# Patient Record
Sex: Male | Born: 1960 | Race: White | Hispanic: No | Marital: Married | Smoking: Never smoker
Health system: Southern US, Community
[De-identification: ages and names within clinical notes are randomized; demographics above are authoritative.]

## PROBLEM LIST (undated history)

## (undated) DIAGNOSIS — R519 Headache, unspecified: Secondary | ICD-10-CM

## (undated) DIAGNOSIS — IMO0002 Reserved for concepts with insufficient information to code with codable children: Secondary | ICD-10-CM

## (undated) DIAGNOSIS — R51 Headache: Secondary | ICD-10-CM

## (undated) DIAGNOSIS — D759 Disease of blood and blood-forming organs, unspecified: Secondary | ICD-10-CM

## (undated) DIAGNOSIS — D581 Hereditary elliptocytosis: Secondary | ICD-10-CM

## (undated) DIAGNOSIS — Q231 Congenital insufficiency of aortic valve: Secondary | ICD-10-CM

## (undated) DIAGNOSIS — D649 Anemia, unspecified: Secondary | ICD-10-CM

## (undated) DIAGNOSIS — F32A Depression, unspecified: Secondary | ICD-10-CM

## (undated) DIAGNOSIS — M51369 Other intervertebral disc degeneration, lumbar region without mention of lumbar back pain or lower extremity pain: Secondary | ICD-10-CM

## (undated) DIAGNOSIS — M5136 Other intervertebral disc degeneration, lumbar region: Secondary | ICD-10-CM

## (undated) DIAGNOSIS — Q2381 Bicuspid aortic valve: Secondary | ICD-10-CM

## (undated) DIAGNOSIS — Z9889 Other specified postprocedural states: Secondary | ICD-10-CM

## (undated) DIAGNOSIS — R112 Nausea with vomiting, unspecified: Secondary | ICD-10-CM

## (undated) DIAGNOSIS — Z87442 Personal history of urinary calculi: Secondary | ICD-10-CM

## (undated) DIAGNOSIS — R3915 Urgency of urination: Secondary | ICD-10-CM

## (undated) DIAGNOSIS — I7789 Other specified disorders of arteries and arterioles: Secondary | ICD-10-CM

## (undated) DIAGNOSIS — Z87820 Personal history of traumatic brain injury: Secondary | ICD-10-CM

## (undated) DIAGNOSIS — R351 Nocturia: Secondary | ICD-10-CM

## (undated) DIAGNOSIS — F329 Major depressive disorder, single episode, unspecified: Secondary | ICD-10-CM

## (undated) HISTORY — PX: CHOLECYSTECTOMY: SHX55

## (undated) HISTORY — PX: OTHER SURGICAL HISTORY: SHX169

---
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
## 1999-11-06 HISTORY — PX: URETEROLITHOTOMY: SHX71

## 2012-05-08 ENCOUNTER — Encounter (HOSPITAL_COMMUNITY): Payer: Self-pay

## 2012-05-08 ENCOUNTER — Observation Stay (HOSPITAL_COMMUNITY)
Admission: EM | Admit: 2012-05-08 | Discharge: 2012-05-09 | Disposition: A | Discharge status: Home / Self Care | Payer: BC Managed Care – PPO | Source: Ambulatory Visit | Attending: Urology | Admitting: Urology

## 2012-05-08 ENCOUNTER — Observation Stay (HOSPITAL_COMMUNITY): Payer: BC Managed Care – PPO | Admitting: Anesthesiology

## 2012-05-08 ENCOUNTER — Emergency Department (HOSPITAL_COMMUNITY): Payer: BC Managed Care – PPO

## 2012-05-08 ENCOUNTER — Encounter (HOSPITAL_COMMUNITY)
Admission: EM | Disposition: A | Discharge status: Home / Self Care | Payer: Self-pay | Source: Ambulatory Visit | Attending: Emergency Medicine

## 2012-05-08 ENCOUNTER — Encounter (HOSPITAL_COMMUNITY): Payer: Self-pay | Admitting: Anesthesiology

## 2012-05-08 DIAGNOSIS — N201 Calculus of ureter: Principal | ICD-10-CM | POA: Insufficient documentation

## 2012-05-08 DIAGNOSIS — N200 Calculus of kidney: Secondary | ICD-10-CM

## 2012-05-08 DIAGNOSIS — Z79899 Other long term (current) drug therapy: Secondary | ICD-10-CM | POA: Insufficient documentation

## 2012-05-08 LAB — POCT I-STAT, CHEM 8
Chloride: 106 mEq/L (ref 96–112)
Glucose, Bld: 111 mg/dL — ABNORMAL HIGH (ref 70–99)
HCT: 34 % — ABNORMAL LOW (ref 39.0–52.0)
Potassium: 3.7 mEq/L (ref 3.5–5.1)
Sodium: 142 mEq/L (ref 135–145)

## 2012-05-08 LAB — URINALYSIS, ROUTINE W REFLEX MICROSCOPIC
Bilirubin Urine: NEGATIVE
Nitrite: NEGATIVE
Specific Gravity, Urine: 1.028 (ref 1.005–1.030)
pH: 6 (ref 5.0–8.0)

## 2012-05-08 LAB — URINE MICROSCOPIC-ADD ON

## 2012-05-08 SURGERY — CYSTOURETEROSCOPY, WITH RETROGRADE PYELOGRAM AND STENT INSERTION
Anesthesia: General | Site: Ureter | Laterality: Right | Wound class: Clean Contaminated

## 2012-05-08 MED ORDER — IBUPROFEN 600 MG PO TABS: 600.0000 mg | ORAL_TABLET | Freq: Four times a day (QID) | ORAL | Status: DC | PRN

## 2012-05-08 MED ORDER — LIDOCAINE HCL 2 % EX GEL
CUTANEOUS | Status: DC | PRN
  Administered 2012-05-08: 1

## 2012-05-08 MED ORDER — KETOROLAC TROMETHAMINE 30 MG/ML IJ SOLN
INTRAMUSCULAR | Status: AC
  Filled 2012-05-08: qty 1

## 2012-05-08 MED ORDER — KETOROLAC TROMETHAMINE 30 MG/ML IJ SOLN
30.0000 mg | Freq: Once | INTRAMUSCULAR | Status: AC
  Administered 2012-05-08: 30 mg via INTRAVENOUS

## 2012-05-08 MED ORDER — MIDAZOLAM HCL 5 MG/5ML IJ SOLN
INTRAMUSCULAR | Status: DC | PRN
  Administered 2012-05-08: 0.5 mg via INTRAVENOUS

## 2012-05-08 MED ORDER — ACETAMINOPHEN 10 MG/ML IV SOLN
INTRAVENOUS | Status: DC | PRN
  Administered 2012-05-08: 1000 mg via INTRAVENOUS

## 2012-05-08 MED ORDER — PROPOFOL 10 MG/ML IV EMUL
INTRAVENOUS | Status: DC | PRN
  Administered 2012-05-08: 200 mg via INTRAVENOUS

## 2012-05-08 MED ORDER — DEXAMETHASONE SODIUM PHOSPHATE 4 MG/ML IJ SOLN
INTRAMUSCULAR | Status: DC | PRN
  Administered 2012-05-08: 10 mg via INTRAVENOUS

## 2012-05-08 MED ORDER — KETOROLAC TROMETHAMINE 30 MG/ML IJ SOLN: 15.0000 mg | Freq: Once | INTRAMUSCULAR | Status: AC | PRN

## 2012-05-08 MED ORDER — SODIUM CHLORIDE 0.9 % IR SOLN
Status: DC | PRN
  Administered 2012-05-08: 4000 mL

## 2012-05-08 MED ORDER — FENTANYL CITRATE 0.05 MG/ML IJ SOLN
100.0000 ug | Freq: Once | INTRAMUSCULAR | Status: AC
  Administered 2012-05-08: 100 ug via INTRAVENOUS

## 2012-05-08 MED ORDER — ONDANSETRON HCL 4 MG/2ML IJ SOLN
4.0000 mg | Freq: Once | INTRAMUSCULAR | Status: AC
  Administered 2012-05-08: 4 mg via INTRAVENOUS

## 2012-05-08 MED ORDER — LIDOCAINE HCL (CARDIAC) 20 MG/ML IV SOLN
INTRAVENOUS | Status: DC | PRN
  Administered 2012-05-08: 30 mg via INTRAVENOUS

## 2012-05-08 MED ORDER — DIAZEPAM 5 MG/ML IJ SOLN
5.0000 mg | Freq: Once | INTRAMUSCULAR | Status: AC
  Administered 2012-05-08: 5 mg via INTRAVENOUS

## 2012-05-08 MED ORDER — HYDROMORPHONE HCL PF 1 MG/ML IJ SOLN
0.5000 mg | Freq: Once | INTRAMUSCULAR | Status: AC
  Administered 2012-05-08: 0.5 mg via INTRAVENOUS
  Filled 2012-05-08: qty 1

## 2012-05-08 MED ORDER — FENTANYL CITRATE 0.05 MG/ML IJ SOLN: 25.0000 ug | INTRAMUSCULAR | Status: DC | PRN

## 2012-05-08 MED ORDER — IOHEXOL 300 MG/ML  SOLN
INTRAMUSCULAR | Status: DC | PRN
  Administered 2012-05-08: 10 mL

## 2012-05-08 MED ORDER — ONDANSETRON HCL 4 MG/2ML IJ SOLN
4.0000 mg | Freq: Once | INTRAMUSCULAR | Status: AC
  Administered 2012-05-08: 4 mg via INTRAVENOUS
  Filled 2012-05-08: qty 2

## 2012-05-08 MED ORDER — CIPROFLOXACIN IN D5W 400 MG/200ML IV SOLN
INTRAVENOUS | Status: DC | PRN
  Administered 2012-05-08: 400 mg via INTRAVENOUS

## 2012-05-08 MED ORDER — SODIUM CHLORIDE 0.9 % IV BOLUS (SEPSIS): 1000.0000 mL | Freq: Once | INTRAVENOUS | Status: AC

## 2012-05-08 MED ORDER — SUCCINYLCHOLINE CHLORIDE 20 MG/ML IJ SOLN
INTRAMUSCULAR | Status: DC | PRN
  Administered 2012-05-08: 150 mg via INTRAVENOUS

## 2012-05-08 MED ORDER — ONDANSETRON HCL 4 MG/2ML IJ SOLN: 4.0000 mg | Freq: Once | INTRAMUSCULAR | Status: AC

## 2012-05-08 MED ORDER — PROMETHAZINE HCL 25 MG/ML IJ SOLN
6.2500 mg | INTRAMUSCULAR | Status: DC | PRN
  Administered 2012-05-08: 6.25 mg via INTRAVENOUS

## 2012-05-08 MED ORDER — TAMSULOSIN HCL 0.4 MG PO CAPS: 0.4000 mg | ORAL_CAPSULE | Freq: Every day | ORAL | Status: DC

## 2012-05-08 MED ORDER — FENTANYL CITRATE 0.05 MG/ML IJ SOLN
100.0000 ug | Freq: Once | INTRAMUSCULAR | Status: AC
  Administered 2012-05-08: 100 ug via INTRAVENOUS
  Filled 2012-05-08: qty 2

## 2012-05-08 MED ORDER — DIAZEPAM 5 MG/ML IJ SOLN
INTRAMUSCULAR | Status: AC
  Filled 2012-05-08: qty 2

## 2012-05-08 MED ORDER — HYDROMORPHONE HCL PF 1 MG/ML IJ SOLN: 1.0000 mg | Freq: Once | INTRAMUSCULAR | Status: AC

## 2012-05-08 MED ORDER — ONDANSETRON 4 MG PO TBDP: 4.0000 mg | ORAL_TABLET | Freq: Three times a day (TID) | ORAL | Status: DC | PRN

## 2012-05-08 MED ORDER — FENTANYL CITRATE 0.05 MG/ML IJ SOLN
INTRAMUSCULAR | Status: DC | PRN
  Administered 2012-05-08 (×2): 25 ug via INTRAVENOUS

## 2012-05-08 MED ORDER — ONDANSETRON HCL 4 MG/2ML IJ SOLN
INTRAMUSCULAR | Status: AC
  Filled 2012-05-08: qty 2

## 2012-05-08 MED ORDER — ONDANSETRON HCL 4 MG/2ML IJ SOLN
INTRAMUSCULAR | Status: DC | PRN
  Administered 2012-05-08 (×2): 1 mg via INTRAVENOUS

## 2012-05-08 MED ORDER — FENTANYL CITRATE 0.05 MG/ML IJ SOLN
INTRAMUSCULAR | Status: AC
  Filled 2012-05-08: qty 2

## 2012-05-08 MED ORDER — LACTATED RINGERS IV SOLN
INTRAVENOUS | Status: DC | PRN
  Administered 2012-05-08 – 2012-05-09 (×2): via INTRAVENOUS

## 2012-05-08 MED ORDER — OXYCODONE-ACETAMINOPHEN 7.5-325 MG PO TABS: 1.0000 | ORAL_TABLET | ORAL | Status: DC | PRN

## 2012-05-08 MED ORDER — OXYCODONE-ACETAMINOPHEN 5-325 MG PO TABS
2.0000 | ORAL_TABLET | Freq: Once | ORAL | Status: AC
  Administered 2012-05-08: 2 via ORAL
  Filled 2012-05-08 (×2): qty 2

## 2012-05-08 MED ORDER — BELLADONNA ALKALOIDS-OPIUM 16.2-60 MG RE SUPP
RECTAL | Status: DC | PRN
  Administered 2012-05-08: 1 via RECTAL

## 2012-05-08 MED ORDER — DIAZEPAM 5 MG/ML IJ SOLN
5.0000 mg | Freq: Once | INTRAMUSCULAR | Status: AC
  Administered 2012-05-08: 5 mg via INTRAVENOUS
  Filled 2012-05-08: qty 2

## 2012-05-08 MED ORDER — HYDROMORPHONE HCL PF 1 MG/ML IJ SOLN
1.0000 mg | Freq: Once | INTRAMUSCULAR | Status: AC
  Administered 2012-05-08: 1 mg via INTRAVENOUS
  Filled 2012-05-08: qty 1

## 2012-05-08 MED ORDER — SODIUM CHLORIDE 0.9 % IV BOLUS (SEPSIS)
1000.0000 mL | Freq: Once | INTRAVENOUS | Status: AC
  Administered 2012-05-08: 1000 mL via INTRAVENOUS

## 2012-05-08 SURGICAL SUPPLY — 16 items
BAG URO CATCHER STRL LF (DRAPE) ×3 IMPLANT
BASKET ZERO TIP NITINOL 2.4FR (BASKET) ×2 IMPLANT
BSKT STON RTRVL ZERO TP 2.4FR (BASKET) ×2
CATH URET 5FR 28IN OPEN ENDED (CATHETERS) ×3 IMPLANT
CLOTH BEACON ORANGE TIMEOUT ST (SAFETY) ×3 IMPLANT
DRAPE CAMERA CLOSED 9X96 (DRAPES) ×3 IMPLANT
GLOVE SURG SS PI 8.0 STRL IVOR (GLOVE) ×3 IMPLANT
GOWN PREVENTION PLUS XLARGE (GOWN DISPOSABLE) ×3 IMPLANT
GOWN STRL REIN XL XLG (GOWN DISPOSABLE) ×3 IMPLANT
KIT BALLN UROMAX 15FX4 (MISCELLANEOUS) ×1 IMPLANT
KIT BALLN UROMAX 26 75X4 (MISCELLANEOUS) ×1
MANIFOLD NEPTUNE II (INSTRUMENTS) ×3 IMPLANT
PACK CYSTO (CUSTOM PROCEDURE TRAY) ×3 IMPLANT
SHEATH URET ACCESS 12FR/35CM (UROLOGICAL SUPPLIES) ×2 IMPLANT
STENT CONTOUR 6FRX26X.038 (STENTS) ×2 IMPLANT
TUBING CONNECTING 10 (TUBING) ×3 IMPLANT

## 2012-05-08 NOTE — ED Notes (Signed)
Right flank pain since driving here, hx of stones several years back and feels the same.

## 2012-05-08 NOTE — Anesthesia Preprocedure Evaluation (Signed)
Anesthesia Evaluation  Patient identified by MRN, date of birth, ID band Patient awake    Reviewed: Allergy & Precautions, H&P , NPO status , Patient's Chart, lab work & pertinent test results  Airway Mallampati: II TM Distance: <3 FB Neck ROM: Full    Dental No notable dental hx.    Pulmonary neg pulmonary ROS,  breath sounds clear to auscultation  Pulmonary exam normal       Cardiovascular negative cardio ROS  Rhythm:Regular Rate:Normal     Neuro/Psych negative neurological ROS  negative psych ROS   GI/Hepatic negative GI ROS, Neg liver ROS,   Endo/Other  negative endocrine ROS  Renal/GU negative Renal ROS  negative genitourinary   Musculoskeletal negative musculoskeletal ROS (+)   Abdominal   Peds negative pediatric ROS (+)  Hematology negative hematology ROS (+)   Anesthesia Other Findings   Reproductive/Obstetrics negative OB ROS                           Anesthesia Physical Anesthesia Plan  ASA: I and Emergent  Anesthesia Plan: General   Post-op Pain Management:    Induction:   Airway Management Planned: Oral ETT  Additional Equipment:   Intra-op Plan:   Post-operative Plan: Extubation in OR  Informed Consent: I have reviewed the patients History and Physical, chart, labs and discussed the procedure including the risks, benefits and alternatives for the proposed anesthesia with the patient or authorized representative who has indicated his/her understanding and acceptance.   Dental advisory given  Plan Discussed with: CRNA and Surgeon  Anesthesia Plan Comments:         Anesthesia Quick Evaluation

## 2012-05-08 NOTE — ED Provider Notes (Signed)
History     CSN: [id number]  Arrival date & time 05/08/12  1258   First MD Initiated Contact with Patient 05/08/12 1324      Chief Complaint  Patient presents with  . Flank Pain     HPI Patient presents with a sudden onset of right flank pain which is severe in nature associated with diaphoresis nausea.  Patient has known history of kidney stones in the past.  States that the pain feels identical to this type pain.  Patient had no prior problems before the onset of the pain. Past Medical History  Diagnosis Date  . Kidney stones     No past surgical history on file.  No family history on file.  History  Substance Use Topics  . Smoking status: Never Smoker   . Smokeless tobacco: Not on file  . Alcohol Use: No      Review of Systems  Unable to perform ROS   Allergies  Review of patient's allergies indicates no known allergies.  Home Medications   Current Outpatient Rx  Name Route Sig Dispense Refill  . IBUPROFEN 600 MG PO TABS Oral Take 1 tablet (600 mg total) by mouth every 6 (six) hours as needed for pain. 30 tablet 0  . OXYCODONE-ACETAMINOPHEN 7.5-325 MG PO TABS Oral Take 1 tablet by mouth every 4 (four) hours as needed for pain. 30 tablet 0  . TAMSULOSIN HCL 0.4 MG PO CAPS Oral Take 1 capsule (0.4 mg total) by mouth daily after supper. 15 capsule 0    BP 95/50  Pulse 57  Temp 98.1 F (36.7 C) (Oral)  Resp 16  SpO2 97%  Physical Exam  Nursing note and vitals reviewed. Constitutional: He is oriented to person, place, and time. He appears well-developed and well-nourished.  Non-toxic appearance. He does not appear ill. He appears distressed.  HENT:  Head: Normocephalic and atraumatic.  Eyes: Pupils are equal, round, and reactive to light.  Neck: Normal range of motion.  Cardiovascular: Normal rate and intact distal pulses.   Pulmonary/Chest: No respiratory distress.  Abdominal: Normal appearance. He exhibits no distension.  Musculoskeletal: Normal range  of motion.  Neurological: He is alert and oriented to person, place, and time. No cranial nerve deficit.  Skin: Skin is warm and dry. No rash noted.  Psychiatric: He has a normal mood and affect. His behavior is normal.    ED Course  Procedures (including critical care time) Scheduled Meds:    . fentaNYL      . fentaNYL  100 mcg Intravenous Once  . fentaNYL  100 mcg Intravenous Once  . ketorolac  30 mg Intravenous Once  . ketorolac      . ondansetron      . ondansetron  4 mg Intravenous Once  . ondansetron (ZOFRAN) IV  4 mg Intravenous Once  . oxyCODONE-acetaminophen  2 tablet Oral Once   Continuous Infusions:  PRN Meds:.  Labs Reviewed  URINALYSIS, ROUTINE W REFLEX MICROSCOPIC - Abnormal; Notable for the following:    Color, Urine AMBER (*)  BIOCHEMICALS MAY BE AFFECTED BY COLOR   APPearance CLOUDY (*)     Hgb urine dipstick LARGE (*)     Protein, ur 30 (*)     Leukocytes, UA TRACE (*)     All other components within normal limits  POCT I-STAT, CHEM 8 - Abnormal; Notable for the following:    Glucose, Bld 111 (*)     Hemoglobin 11.6 (*)     HCT  34.0 (*)     All other components within normal limits  URINE MICROSCOPIC-ADD ON   No results found.   1. Kidney stone       MDM         Nelia Shi, MD 05/08/12 [phone]

## 2012-05-08 NOTE — H&P (Signed)
Urology Admission H&P  Chief Complaint: right flank pain  History of Present Illness: Austin Bolton is a 51 yo WM with a history of stones who had the onset today of severe right flank pain with nausea that hasn't responded to IV meds.   A CT shows a 3.32mm right distal stone with some obstruction.  He has had prior stones and has required surgical removal of a prior stone.  He has no other associate signs or symptoms.  Past Medical History  Diagnosis Date  . Kidney stones    No past surgical history on file.  Home Medications: he reports no meds.  (Not in a hospital admission) Allergies: No Known Allergies  No family history on file. Social History:  reports that he has never smoked. He does not have any smokeless tobacco history on file. He reports that he uses illicit drugs (Marijuana). He reports that he does not drink alcohol.  Review of Systems  Constitutional: Positive for diaphoresis. Negative for fever.  HENT: Negative.   Eyes: Negative.   Respiratory: Negative.   Cardiovascular: Negative.   Gastrointestinal: Positive for nausea, vomiting and abdominal pain.       Right flank severe  Genitourinary:       Right stream deviation  Musculoskeletal: Negative.   Skin: Negative.   Neurological: Negative.   Endo/Heme/Allergies: Negative.   Psychiatric/Behavioral: Negative.     Physical Exam:  Vital signs in last 24 hours: Temp:  [96.9 F (36.1 C)-98.1 F (36.7 C)] 96.9 F (36.1 C) (07/04 1859) Pulse Rate:  [57-83] 81  (07/04 2123) Resp:  [16-20] 20  (07/04 2123) BP: (95-133)/(50-87) 121/68 mmHg (07/04 2123) SpO2:  [97 %-100 %] 100 % (07/04 2123) Physical Exam  Constitutional: He is oriented to person, place, and time. He appears well-developed and well-nourished. He appears distressed.  HENT:  Head: Normocephalic and atraumatic.  Neck: Normal range of motion. Neck supple.  Cardiovascular: Normal rate and regular rhythm.   Respiratory: Effort normal and breath sounds  normal.  GI: Soft. There is tenderness (right flank and lower quadrant).  Musculoskeletal: Normal range of motion.  Neurological: He is alert and oriented to person, place, and time.  Skin: Skin is warm. He is diaphoretic.  Psychiatric:       He is in severe pain but appropriate    Laboratory Data:  Results for orders placed during the hospital encounter of 05/08/12 (from the past 24 hour(s))  URINALYSIS, ROUTINE W REFLEX MICROSCOPIC     Status: Abnormal   Collection Time   05/08/12  1:46 PM      Component Value Range   Color, Urine AMBER (*) YELLOW   APPearance CLOUDY (*) CLEAR   Specific Gravity, Urine 1.028  1.005 - 1.030   pH 6.0  5.0 - 8.0   Glucose, UA NEGATIVE  NEGATIVE mg/dL   Hgb urine dipstick LARGE (*) NEGATIVE   Bilirubin Urine NEGATIVE  NEGATIVE   Ketones, ur NEGATIVE  NEGATIVE mg/dL   Protein, ur 30 (*) NEGATIVE mg/dL   Urobilinogen, UA 0.2  0.0 - 1.0 mg/dL   Nitrite NEGATIVE  NEGATIVE   Leukocytes, UA TRACE (*) NEGATIVE  URINE MICROSCOPIC-ADD ON     Status: Normal   Collection Time   05/08/12  1:46 PM      Component Value Range   Squamous Epithelial / LPF RARE  RARE   WBC, UA 0-2  <3 WBC/hpf   RBC / HPF TOO NUMEROUS TO COUNT  <3 RBC/hpf  Bacteria, UA RARE  RARE   Urine-Other FEW YEAST    POCT I-STAT, CHEM 8     Status: Abnormal   Collection Time   05/08/12  2:03 PM      Component Value Range   Sodium 142  135 - 145 mEq/L   Potassium 3.7  3.5 - 5.1 mEq/L   Chloride 106  96 - 112 mEq/L   BUN 13  6 - 23 mg/dL   Creatinine, Ser 4.69  0.50 - 1.35 mg/dL   Glucose, Bld 629 (*) 70 - 99 mg/dL   Calcium, Ion 5.28  1.12 - 1.23 mmol/L   TCO2 24  0 - 100 mmol/L   Hemoglobin 11.6 (*) 13.0 - 17.0 g/dL   HCT 41.3 (*) 24.4 - 01.0 %   No results found for this or any previous visit (from the past 240 hour(s)). Creatinine:  Basename 05/08/12 1403  CREATININE 0.90   Baseline Creatinine:0.90  CT films reviewed.  Impression/Assessment:  Symptomatic right distal  stone.  Plan:  Right ureteroscopic stone extraction with probable stent.   Risks reviewed.  Rhet Rorke J 05/08/2012, 10:02 PM

## 2012-05-08 NOTE — Brief Op Note (Signed)
05/08/2012  10:59 PM  PATIENT:  Karis Juba  51 y.o. male  PRE-OPERATIVE DIAGNOSIS:  Right Ureteral Stone  POST-OPERATIVE DIAGNOSIS:  Right Ureteral Stone  PROCEDURE:  Procedure(s) (LRB): CYSTOSCOPY WITH RETROGRADE PYELOGRAM, URETEROSCOPY WITH STONE EXTRACTION  AND STENT PLACEMENT (Right)  SURGEON:  Surgeon(s) and Role:    * Anner Crete, MD - Primary  PHYSICIAN ASSISTANT:   ASSISTANTS: none   ANESTHESIA:   general  EBL:  Total I/O In: -  Out: 100 [Urine:100]  BLOOD ADMINISTERED:none  DRAINS: 6x26 right JJ stent   LOCAL MEDICATIONS USED:  10ml 2% lidocaine jelly  SPECIMEN:  Source of Specimen:  stone  DISPOSITION OF SPECIMEN:  given to wife  COUNTS:  YES  TOURNIQUET:  * No tourniquets in log *  DICTATION: .Other Dictation: Dictation Number [id number]  PLAN OF CARE: Admit for overnight observation  PATIENT DISPOSITION:  PACU - hemodynamically stable.   Delay start of Pharmacological VTE agent (>24hrs) due to surgical blood loss or risk of bleeding: no

## 2012-05-08 NOTE — ED Provider Notes (Signed)
Pt continues to have persistent pain. Discussed with Dr Annabell Howells. Asked to give Pt valium and reassess. If symptoms improved can f/u in office. If not, transfer to [facility].   Pt symptoms improved momentarily with valium but then began again. Spoke with Dr Annabell Howells who will accept pt in transfer.   Loren Racer, MD 05/08/12 2031

## 2012-05-08 NOTE — Anesthesia Postprocedure Evaluation (Signed)
  Anesthesia Post-op Note  Patient: Austin Bolton  Procedure(s) Performed: Procedure(s) (LRB): CYSTOSCOPY WITH RETROGRADE PYELOGRAM, URETEROSCOPY AND STENT PLACEMENT (Right)  Patient Location: PACU  Anesthesia Type: General  Level of Consciousness: awake and alert   Airway and Oxygen Therapy: Patient Spontanous Breathing  Post-op Pain: mild  Post-op Assessment: Post-op Vital signs reviewed, Patient's Cardiovascular Status Stable, Respiratory Function Stable, Patent Airway and No signs of Nausea or vomiting  Post-op Vital Signs: stable  Complications: No apparent anesthesia complications

## 2012-05-08 NOTE — OR Nursing (Signed)
Right Ureteral Stone sent with Dr. Annabell Howells

## 2012-05-08 NOTE — Transfer of Care (Signed)
Immediate Anesthesia Transfer of Care Note  Patient: Austin Bolton  Procedure(s) Performed: Procedure(s) (LRB): CYSTOSCOPY WITH RETROGRADE PYELOGRAM, URETEROSCOPY AND STENT PLACEMENT (Right)  Patient Location: PACU  Anesthesia Type: General  Level of Consciousness: awake and sedated  Airway & Oxygen Therapy: Patient Spontanous Breathing and Patient connected to face mask  Post-op Assessment: Report given to PACU RN and Post -op Vital signs reviewed and stable  Post vital signs: Reviewed and stable  Complications: No apparent anesthesia complications

## 2012-05-09 ENCOUNTER — Other Ambulatory Visit: Payer: Self-pay | Admitting: Urology

## 2012-05-09 ENCOUNTER — Encounter (HOSPITAL_BASED_OUTPATIENT_CLINIC_OR_DEPARTMENT_OTHER): Payer: Self-pay | Admitting: *Deleted

## 2012-05-09 MED ORDER — ONDANSETRON HCL 4 MG/2ML IJ SOLN: 4.0000 mg | INTRAMUSCULAR | Status: DC | PRN

## 2012-05-09 MED ORDER — HYOSCYAMINE SULFATE 0.125 MG SL SUBL: 0.1250 mg | SUBLINGUAL_TABLET | SUBLINGUAL | Status: DC | PRN

## 2012-05-09 MED ORDER — TAMSULOSIN HCL 0.4 MG PO CAPS: 0.4000 mg | ORAL_CAPSULE | Freq: Every day | ORAL | Status: DC

## 2012-05-09 MED ORDER — OXYCODONE-ACETAMINOPHEN 5-325 MG PO TABS: 1.0000 | ORAL_TABLET | ORAL | Status: DC | PRN

## 2012-05-09 MED ORDER — ZOLPIDEM TARTRATE 5 MG PO TABS: 5.0000 mg | ORAL_TABLET | Freq: Every evening | ORAL | Status: DC | PRN

## 2012-05-09 MED ORDER — HYOSCYAMINE SULFATE 0.125 MG SL SUBL
0.1250 mg | SUBLINGUAL_TABLET | SUBLINGUAL | Status: DC | PRN
  Administered 2012-05-09 (×2): 0.125 mg via ORAL
  Filled 2012-05-09 (×2): qty 1

## 2012-05-09 MED ORDER — TAMSULOSIN HCL 0.4 MG PO CAPS
0.4000 mg | ORAL_CAPSULE | Freq: Every day | ORAL | Status: DC
  Administered 2012-05-09: 0.4 mg via ORAL
  Filled 2012-05-09 (×2): qty 1

## 2012-05-09 MED ORDER — OXYCODONE-ACETAMINOPHEN 5-325 MG PO TABS: 1.0000 | ORAL_TABLET | Freq: Four times a day (QID) | ORAL | Status: AC | PRN

## 2012-05-09 MED ORDER — PHENAZOPYRIDINE HCL 200 MG PO TABS
200.0000 mg | ORAL_TABLET | Freq: Three times a day (TID) | ORAL | Status: DC | PRN
  Administered 2012-05-09: 200 mg via ORAL
  Filled 2012-05-09: qty 1

## 2012-05-09 MED ORDER — ACETAMINOPHEN 325 MG PO TABS: 650.0000 mg | ORAL_TABLET | ORAL | Status: DC | PRN

## 2012-05-09 MED ORDER — PHENAZOPYRIDINE HCL 200 MG PO TABS: 200.0000 mg | ORAL_TABLET | Freq: Three times a day (TID) | ORAL | Status: AC | PRN

## 2012-05-09 MED ORDER — DIAZEPAM 5 MG PO TABS: 5.0000 mg | ORAL_TABLET | Freq: Four times a day (QID) | ORAL | Status: AC | PRN

## 2012-05-09 MED ORDER — POTASSIUM CHLORIDE IN NACL 20-0.45 MEQ/L-% IV SOLN
INTRAVENOUS | Status: DC
  Administered 2012-05-09: 01:00:00 via INTRAVENOUS
  Filled 2012-05-09 (×3): qty 1000

## 2012-05-09 MED ORDER — HYDROMORPHONE HCL PF 1 MG/ML IJ SOLN: 0.5000 mg | INTRAMUSCULAR | Status: DC | PRN

## 2012-05-09 MED ORDER — BISACODYL 10 MG RE SUPP: 10.0000 mg | Freq: Every day | RECTAL | Status: DC | PRN

## 2012-05-09 MED FILL — Lidocaine HCl Gel 2%: CUTANEOUS | Qty: 5 | Status: AC

## 2012-05-09 NOTE — Progress Notes (Signed)
NPO AFTER MN. ARRIVES AT 1015. CURRENT ISTAT DONE 05-08-2012 IN [facility]. NEEDS EKG. MAY TAKE RX PAIN MED IF NEEDED W/ SIPS OF WATER AM OF SURG.

## 2012-05-09 NOTE — Progress Notes (Signed)
DC to home. To car by [facility]. No change from AM assessment.Austin Bolton  

## 2012-05-09 NOTE — Discharge Summary (Signed)
Physician Discharge Summary  Patient ID: Austin Bolton MRN: [id number] DOB/AGE: [id number] y.o.  Admit date: 05/08/2012 Discharge date: 05/09/2012  Admission Diagnoses: right distal stone  Discharge Diagnoses: same Active Problems:  * No active hospital problems. *    Discharged Condition: good  Hospital Course: [name] was admitted yesterday for an obstructing 3mm right distal stone with intractable pain.   He had ureteroscopic stone extraction with right stent placement and is doing better with reduced pain this morning.   Consults: None  Significant Diagnostic Studies: CT scan  Treatments: surgery: ureteroscopy and stent.  Discharge Exam: Blood pressure 126/81, pulse 73, temperature 98.2 F (36.8 C), temperature source Oral, resp. rate 20, height 6\' 2"  (1.88 m), weight 81.647 kg (180 lb), SpO2 98.00%. General appearance: alert and no distress  Disposition: Home   Medication List  As of 05/09/2012  7:01 AM   TAKE these medications         diazepam 5 MG tablet   Commonly known as: VALIUM   Take 1 tablet (5 mg total) by mouth every 6 (six) hours as needed for anxiety (flank pain not controlled by pain med).      hyoscyamine 0.125 MG SL tablet   Commonly known as: LEVSIN SL   Take 1 tablet (0.125 mg total) by mouth every 4 (four) hours as needed.      oxyCODONE-acetaminophen 5-325 MG per tablet   Commonly known as: PERCOCET   Take 1-2 tablets by mouth every 6 (six) hours as needed.      phenazopyridine 200 MG tablet   Commonly known as: PYRIDIUM   Take 1 tablet (200 mg total) by mouth 3 (three) times daily as needed (burning).      Tamsulosin HCl 0.4 MG Caps   Commonly known as: FLOMAX   Take 1 capsule (0.4 mg total) by mouth daily after breakfast.           Follow-up Information    Call Anner Crete, MD. (Please make an appt for 1 week to have the stent removed)    Contact information:   [address] [place] [address] [place]  [address] [phone]         He will be set up for out patient stent removal next week.  SignedAnner Crete 05/09/2012, 7:01 AM

## 2012-05-09 NOTE — Op Note (Signed)
Austin Bolton, REASON                  ACCOUNT NO.:  [id number]  MEDICAL RECORD NO.:  [id number]  LOCATION:  [facility]                         FACILITY:  [facility]  PHYSICIAN:  Excell Seltzer. Annabell Howells, M.D.    DATE OF BIRTH:  10-15-1961  DATE OF PROCEDURE:  05/08/2012 DATE OF DISCHARGE:                              OPERATIVE REPORT   PROCEDURE:  Cystoscopy, right retrograde pyelogram, right ureteroscopic stone extraction, insertion of right double-J stent.  PREOPERATIVE DIAGNOSIS:  Right distal ureteral stone.  POSTOPERATIVE DIAGNOSIS:  Right distal ureteral stone.  SURGEON:  Excell Seltzer. Annabell Howells, MD  ANESTHESIA:  General.  SPECIMEN:  Stone.  DRAINS:  Six-French 26-cm double-J stent.  COMPLICATIONS:  None.  INDICATIONS:  Austin Bolton is a 51 year old white male, who had the onset today of severe right flank pain.  He was seen in the emergency room at [facility].  His pain remained intractable despite Dilaudid, fentanyl, Toradol, and Valium.  He was transferred to [facility] for definitive management of the stone.  FINDINGS OF PROCEDURE:  A full informed consent was reviewed with the patient preoperatively.  He was taken to the operating room where general anesthetic was induced.  He was given 400 mg of Cipro IV.  He was placed in lithotomy position.  His perineum and genitalia were prepped with Betadine solution and he was draped in usual sterile fashion.  Cystoscopy was performed using a 22-French scope and 12-degree lens. Examination revealed a normal urethra.  The external sphincter was intact.  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  Examination of bladder revealed some hypervascularity but no mucosal lesions, tumors, or stones were noted.  Ureteral orifices were unremarkable.  The right ureteral orifice was cannulated with 5-French open-end catheter and contrast was instilled, which revealed a very tight distal a centimeter and  half of the ureter with a filling defect proximal to this consists of the stone and mild dilation of the ureter proximally.  Once retrograde pyelogram had been performed, a guidewire was passed to the kidney without difficulty.  An attempt was then made to pass a 12-French introducer sheath dilator inner core to dilate the ureter.  This was unsuccessful due to the very tight intramural ureter.  I then passed a 4 cm x 15-French balloon over the wire through the cystoscope across the distal ureter, and the balloon was inflated to 18 atmospheres with disappearance of the waist.  Once the inflation had been completed, the balloon was deflated and removed.  The cystoscope was removed leaving the wire in place.  The 6.4-French short ureteroscope was then passed alongside the wire. There were some mucosal tearing and intramural ureter was expected with dilation.  Approximately the stone was identified, it had rounded all of brown appearance consistent with a calcium oxalate stone.  It was engaged with a Nitinol basket and removed intact.  The cystoscope was then reinserted over the guidewire and a 6-French 26- cm double-J stent was then inserted to the kidney under fluoroscopic guidance.  The wire was removed leaving good coil in the kidney, a good coil in the bladder.  The bladder was drained.  The cystoscope was removed.  Urethra was instilled with 10 mL of 2% lidocaine jelly, and a B and O suppository was placed.  He was taken down from lithotomy position.  His anesthetic was reversed.  He was moved to recovery room in stable condition.  There were no complications.     Excell Seltzer. Annabell Howells, M.D.     JJW/MEDQ  D:  05/08/2012  T:  05/09/2012  Job:  [id number]

## 2012-05-13 ENCOUNTER — Encounter (HOSPITAL_BASED_OUTPATIENT_CLINIC_OR_DEPARTMENT_OTHER)
Admission: RE | Disposition: A | Discharge status: Home / Self Care | Payer: Self-pay | Source: Ambulatory Visit | Attending: Urology

## 2012-05-13 ENCOUNTER — Ambulatory Visit (HOSPITAL_BASED_OUTPATIENT_CLINIC_OR_DEPARTMENT_OTHER): Payer: BC Managed Care – PPO | Admitting: Anesthesiology

## 2012-05-13 ENCOUNTER — Encounter (HOSPITAL_BASED_OUTPATIENT_CLINIC_OR_DEPARTMENT_OTHER): Payer: Self-pay | Admitting: *Deleted

## 2012-05-13 ENCOUNTER — Encounter (HOSPITAL_BASED_OUTPATIENT_CLINIC_OR_DEPARTMENT_OTHER): Payer: Self-pay | Admitting: Anesthesiology

## 2012-05-13 ENCOUNTER — Ambulatory Visit (HOSPITAL_BASED_OUTPATIENT_CLINIC_OR_DEPARTMENT_OTHER)
Admission: RE | Admit: 2012-05-13 | Discharge: 2012-05-13 | Disposition: A | Discharge status: Home / Self Care | Payer: BC Managed Care – PPO | Source: Ambulatory Visit | Attending: Urology | Admitting: Urology

## 2012-05-13 ENCOUNTER — Other Ambulatory Visit: Payer: Self-pay

## 2012-05-13 DIAGNOSIS — R109 Unspecified abdominal pain: Secondary | ICD-10-CM | POA: Insufficient documentation

## 2012-05-13 DIAGNOSIS — N201 Calculus of ureter: Secondary | ICD-10-CM | POA: Insufficient documentation

## 2012-05-13 DIAGNOSIS — R110 Nausea: Secondary | ICD-10-CM | POA: Insufficient documentation

## 2012-05-13 DIAGNOSIS — Z466 Encounter for fitting and adjustment of urinary device: Secondary | ICD-10-CM | POA: Insufficient documentation

## 2012-05-13 HISTORY — PX: CYSTOSCOPY W/ URETERAL STENT REMOVAL: SHX1430

## 2012-05-13 HISTORY — DX: Personal history of traumatic brain injury: Z87.820

## 2012-05-13 HISTORY — DX: Personal history of urinary calculi: Z87.442

## 2012-05-13 HISTORY — DX: Nocturia: R35.1

## 2012-05-13 HISTORY — DX: Urgency of urination: R39.15

## 2012-05-13 HISTORY — DX: Other specified disorders of arteries and arterioles: I77.89

## 2012-05-13 SURGERY — REMOVAL, STENT, URETER, CYSTOSCOPIC
Anesthesia: General | Site: Ureter | Laterality: Right | Wound class: Clean Contaminated

## 2012-05-13 MED ORDER — PROMETHAZINE HCL 25 MG/ML IJ SOLN: 6.2500 mg | INTRAMUSCULAR | Status: DC | PRN

## 2012-05-13 MED ORDER — PROPOFOL 10 MG/ML IV EMUL
INTRAVENOUS | Status: DC | PRN
  Administered 2012-05-13: 200 mg via INTRAVENOUS

## 2012-05-13 MED ORDER — SODIUM CHLORIDE 0.9 % IV SOLN: 250.0000 mL | INTRAVENOUS | Status: DC | PRN

## 2012-05-13 MED ORDER — SODIUM CHLORIDE 0.9 % IJ SOLN: 3.0000 mL | INTRAMUSCULAR | Status: DC | PRN

## 2012-05-13 MED ORDER — CIPROFLOXACIN IN D5W 400 MG/200ML IV SOLN
400.0000 mg | INTRAVENOUS | Status: AC
  Administered 2012-05-13: 400 mg via INTRAVENOUS

## 2012-05-13 MED ORDER — CIPROFLOXACIN IN D5W 400 MG/200ML IV SOLN: 400.0000 mg | INTRAVENOUS | Status: DC

## 2012-05-13 MED ORDER — LACTATED RINGERS IV SOLN
INTRAVENOUS | Status: DC
  Administered 2012-05-13: 100 mL/h via INTRAVENOUS
  Administered 2012-05-13: 11:00:00 via INTRAVENOUS

## 2012-05-13 MED ORDER — FENTANYL CITRATE 0.05 MG/ML IJ SOLN: 25.0000 ug | INTRAMUSCULAR | Status: DC | PRN

## 2012-05-13 MED ORDER — OXYCODONE HCL 5 MG PO TABS
5.0000 mg | ORAL_TABLET | ORAL | Status: DC | PRN
  Administered 2012-05-13: 5 mg via ORAL

## 2012-05-13 MED ORDER — ONDANSETRON HCL 4 MG/2ML IJ SOLN
4.0000 mg | Freq: Once | INTRAMUSCULAR | Status: AC
  Administered 2012-05-13: 4 mg via INTRAVENOUS

## 2012-05-13 MED ORDER — ACETAMINOPHEN 650 MG RE SUPP: 650.0000 mg | RECTAL | Status: DC | PRN

## 2012-05-13 MED ORDER — DEXAMETHASONE SODIUM PHOSPHATE 4 MG/ML IJ SOLN
INTRAMUSCULAR | Status: DC | PRN
  Administered 2012-05-13: 10 mg via INTRAVENOUS

## 2012-05-13 MED ORDER — ONDANSETRON HCL 4 MG/2ML IJ SOLN
INTRAMUSCULAR | Status: DC | PRN
  Administered 2012-05-13: 4 mg via INTRAVENOUS

## 2012-05-13 MED ORDER — LIDOCAINE HCL (CARDIAC) 20 MG/ML IV SOLN
INTRAVENOUS | Status: DC | PRN
  Administered 2012-05-13: 60 mg via INTRAVENOUS

## 2012-05-13 MED ORDER — BELLADONNA ALKALOIDS-OPIUM 16.2-60 MG RE SUPP
RECTAL | Status: DC | PRN
  Administered 2012-05-13: 1 via RECTAL

## 2012-05-13 MED ORDER — METOCLOPRAMIDE HCL 5 MG/ML IJ SOLN
INTRAMUSCULAR | Status: DC | PRN
  Administered 2012-05-13 (×2): 5 mg via INTRAVENOUS

## 2012-05-13 MED ORDER — KETOROLAC TROMETHAMINE 30 MG/ML IJ SOLN
INTRAMUSCULAR | Status: DC | PRN
  Administered 2012-05-13: 30 mg via INTRAVENOUS

## 2012-05-13 MED ORDER — FENTANYL CITRATE 0.05 MG/ML IJ SOLN
INTRAMUSCULAR | Status: DC | PRN
  Administered 2012-05-13 (×2): 25 ug via INTRAVENOUS
  Administered 2012-05-13: 50 ug via INTRAVENOUS

## 2012-05-13 MED ORDER — ACETAMINOPHEN 325 MG PO TABS: 650.0000 mg | ORAL_TABLET | ORAL | Status: DC | PRN

## 2012-05-13 MED ORDER — SODIUM CHLORIDE 0.9 % IR SOLN
Status: DC | PRN
  Administered 2012-05-13: 3000 mL

## 2012-05-13 MED ORDER — ONDANSETRON HCL 4 MG/2ML IJ SOLN: 4.0000 mg | Freq: Four times a day (QID) | INTRAMUSCULAR | Status: DC | PRN

## 2012-05-13 MED ORDER — MIDAZOLAM HCL 5 MG/5ML IJ SOLN
INTRAMUSCULAR | Status: DC | PRN
  Administered 2012-05-13 (×2): 1 mg via INTRAVENOUS

## 2012-05-13 MED ORDER — SODIUM CHLORIDE 0.9 % IJ SOLN
3.0000 mL | Freq: Two times a day (BID) | INTRAMUSCULAR | Status: DC
Start: 2012-05-13 — End: 2012-05-13

## 2012-05-13 SURGICAL SUPPLY — 32 items
BAG DRAIN URO-CYSTO SKYTR STRL (DRAIN) ×2 IMPLANT
BAG DRN UROCATH (DRAIN) ×1
BASKET LASER NITINOL 1.9FR (BASKET) IMPLANT
BASKET STNLS GEMINI 4WIRE 3FR (BASKET) IMPLANT
BASKET ZERO TIP NITINOL 2.4FR (BASKET) IMPLANT
BRUSH URET BIOPSY 3F (UROLOGICAL SUPPLIES) IMPLANT
BSKT STON RTRVL 120 1.9FR (BASKET)
BSKT STON RTRVL GEM 120X11 3FR (BASKET)
BSKT STON RTRVL ZERO TP 2.4FR (BASKET)
CANISTER SUCT LVC 12 LTR MEDI- (MISCELLANEOUS) ×1 IMPLANT
CATH URET 5FR 28IN CONE TIP (BALLOONS)
CATH URET 5FR 28IN OPEN ENDED (CATHETERS) IMPLANT
CATH URET 5FR 70CM CONE TIP (BALLOONS) IMPLANT
CLOTH BEACON ORANGE TIMEOUT ST (SAFETY) ×2 IMPLANT
DRAPE CAMERA CLOSED 9X96 (DRAPES) ×2 IMPLANT
ELECT REM PT RETURN 9FT ADLT (ELECTROSURGICAL)
ELECTRODE REM PT RTRN 9FT ADLT (ELECTROSURGICAL) IMPLANT
GLOVE SURG SS PI 8.0 STRL IVOR (GLOVE) ×2 IMPLANT
GOWN PREVENTION PLUS LG XLONG (DISPOSABLE) ×2 IMPLANT
GOWN STRL REIN XL XLG (GOWN DISPOSABLE) ×2 IMPLANT
GUIDEWIRE 0.038 PTFE COATED (WIRE) IMPLANT
GUIDEWIRE ANG ZIPWIRE 038X150 (WIRE) IMPLANT
GUIDEWIRE STR DUAL SENSOR (WIRE) ×2 IMPLANT
KIT BALLIN UROMAX 15FX10 (LABEL) IMPLANT
KIT BALLN UROMAX 15FX4 (MISCELLANEOUS) IMPLANT
KIT BALLN UROMAX 26 75X4 (MISCELLANEOUS)
LASER FIBER DISP (UROLOGICAL SUPPLIES) IMPLANT
LASER FIBER DISP 1000U (UROLOGICAL SUPPLIES) IMPLANT
PACK CYSTOSCOPY (CUSTOM PROCEDURE TRAY) ×2 IMPLANT
SET HIGH PRES BAL DIL (LABEL)
SHEATH URET ACCESS 12FR/35CM (UROLOGICAL SUPPLIES) IMPLANT
SHEATH URET ACCESS 12FR/55CM (UROLOGICAL SUPPLIES) IMPLANT

## 2012-05-13 NOTE — Anesthesia Postprocedure Evaluation (Signed)
  Anesthesia Post-op Note  Patient: Austin Bolton  Procedure(s) Performed: Procedure(s) (LRB): CYSTOSCOPY WITH STENT REMOVAL (Right)  Patient Location: PACU  Anesthesia Type: General  Level of Consciousness: awake and alert   Airway and Oxygen Therapy: Patient Spontanous Breathing  Post-op Pain: mild  Post-op Assessment: Post-op Vital signs reviewed, Patient's Cardiovascular Status Stable, Respiratory Function Stable, Patent Airway and No signs of Nausea or vomiting  Post-op Vital Signs: stable  Complications: No apparent anesthesia complications

## 2012-05-13 NOTE — Anesthesia Procedure Notes (Signed)
Procedure Name: LMA Insertion Date/Time: 05/13/2012 10:26 AM Performed by: Norva Pavlov Pre-anesthesia Checklist: Patient identified, Emergency Drugs available, Suction available and Patient being monitored Patient Re-evaluated:Patient Re-evaluated prior to inductionOxygen Delivery Method: Circle System Utilized Preoxygenation: Pre-oxygenation with 100% oxygen Intubation Type: IV induction Ventilation: Mask ventilation without difficulty LMA: LMA with gastric port inserted LMA Size: 4.0 Number of attempts: 1 Placement Confirmation: positive ETCO2 Tube secured with: Tape Dental Injury: Teeth and Oropharynx as per pre-operative assessment

## 2012-05-13 NOTE — Interval H&P Note (Signed)
History and Physical Interval Note:  05/13/2012 9:37 AM  Austin Bolton  has presented today for surgery, with the diagnosis of right urethral stent  The various methods of treatment have been discussed with the patient and family. After consideration of risks, benefits and other options for treatment, the patient has consented to  Procedure(s) (LRB): CYSTOSCOPY WITH STENT REMOVAL (Right) as a surgical intervention .  The patient's history has been reviewed, patient examined, no change in status, stable for surgery.  I have reviewed the patients' chart and labs.  Questions were answered to the patient's satisfaction.     [name] J

## 2012-05-13 NOTE — H&P (View-Only) (Signed)
Urology Admission H&P  Chief Complaint: right flank pain  History of Present Illness: Austin Bolton is a 50 yo WM with a history of stones who had the onset today of severe right flank pain with nausea that hasn't responded to IV meds.   A CT shows a 3.3mm right distal stone with some obstruction.  He has had prior stones and has required surgical removal of a prior stone.  He has no other associate signs or symptoms.  Past Medical History  Diagnosis Date  . Kidney stones    No past surgical history on file.  Home Medications: he reports no meds.  (Not in a hospital admission) Allergies: No Known Allergies  No family history on file. Social History:  reports that he has never smoked. He does not have any smokeless tobacco history on file. He reports that he uses illicit drugs (Marijuana). He reports that he does not drink alcohol.  Review of Systems  Constitutional: Positive for diaphoresis. Negative for fever.  HENT: Negative.   Eyes: Negative.   Respiratory: Negative.   Cardiovascular: Negative.   Gastrointestinal: Positive for nausea, vomiting and abdominal pain.       Right flank severe  Genitourinary:       Right stream deviation  Musculoskeletal: Negative.   Skin: Negative.   Neurological: Negative.   Endo/Heme/Allergies: Negative.   Psychiatric/Behavioral: Negative.     Physical Exam:  Vital signs in last 24 hours: Temp:  [96.9 F (36.1 C)-98.1 F (36.7 C)] 96.9 F (36.1 C) (07/04 1859) Pulse Rate:  [57-83] 81  (07/04 2123) Resp:  [16-20] 20  (07/04 2123) BP: (95-133)/(50-87) 121/68 mmHg (07/04 2123) SpO2:  [97 %-100 %] 100 % (07/04 2123) Physical Exam  Constitutional: He is oriented to person, place, and time. He appears well-developed and well-nourished. He appears distressed.  HENT:  Head: Normocephalic and atraumatic.  Neck: Normal range of motion. Neck supple.  Cardiovascular: Normal rate and regular rhythm.   Respiratory: Effort normal and breath sounds  normal.  GI: Soft. There is tenderness (right flank and lower quadrant).  Musculoskeletal: Normal range of motion.  Neurological: He is alert and oriented to person, place, and time.  Skin: Skin is warm. He is diaphoretic.  Psychiatric:       He is in severe pain but appropriate    Laboratory Data:  Results for orders placed during the hospital encounter of 05/08/12 (from the past 24 hour(s))  URINALYSIS, ROUTINE W REFLEX MICROSCOPIC     Status: Abnormal   Collection Time   05/08/12  1:46 PM      Component Value Range   Color, Urine AMBER (*) YELLOW   APPearance CLOUDY (*) CLEAR   Specific Gravity, Urine 1.028  1.005 - 1.030   pH 6.0  5.0 - 8.0   Glucose, UA NEGATIVE  NEGATIVE mg/dL   Hgb urine dipstick LARGE (*) NEGATIVE   Bilirubin Urine NEGATIVE  NEGATIVE   Ketones, ur NEGATIVE  NEGATIVE mg/dL   Protein, ur 30 (*) NEGATIVE mg/dL   Urobilinogen, UA 0.2  0.0 - 1.0 mg/dL   Nitrite NEGATIVE  NEGATIVE   Leukocytes, UA TRACE (*) NEGATIVE  URINE MICROSCOPIC-ADD ON     Status: Normal   Collection Time   05/08/12  1:46 PM      Component Value Range   Squamous Epithelial / LPF RARE  RARE   WBC, UA 0-2  <3 WBC/hpf   RBC / HPF TOO NUMEROUS TO COUNT  <3 RBC/hpf     Bacteria, UA RARE  RARE   Urine-Other FEW YEAST    POCT I-STAT, CHEM 8     Status: Abnormal   Collection Time   05/08/12  2:03 PM      Component Value Range   Sodium 142  135 - 145 mEq/L   Potassium 3.7  3.5 - 5.1 mEq/L   Chloride 106  96 - 112 mEq/L   BUN 13  6 - 23 mg/dL   Creatinine, Ser 0.90  0.50 - 1.35 mg/dL   Glucose, Bld 111 (*) 70 - 99 mg/dL   Calcium, Ion 1.20  1.12 - 1.23 mmol/L   TCO2 24  0 - 100 mmol/L   Hemoglobin 11.6 (*) 13.0 - 17.0 g/dL   HCT 34.0 (*) 39.0 - 52.0 %   No results found for this or any previous visit (from the past 240 hour(s)). Creatinine:  Basename 05/08/12 1403  CREATININE 0.90   Baseline Creatinine:0.90  CT films reviewed.  Impression/Assessment:  Symptomatic right distal  stone.  Plan:  Right ureteroscopic stone extraction with probable stent.   Risks reviewed.  Nell Schrack J 05/08/2012, 10:02 PM       

## 2012-05-13 NOTE — Transfer of Care (Signed)
Immediate Anesthesia Transfer of Care Note  Patient: Austin Bolton  Procedure(s) Performed: Procedure(s) (LRB): CYSTOSCOPY WITH STENT REMOVAL (Right)  Patient Location: PACU  Anesthesia Type: General  Level of Consciousness: drowsy, arouses to name, follows commands  Airway & Oxygen Therapy: Patient Spontanous Breathing and Patient connected to face mask oxygen  Post-op Assessment: Report given to PACU RN and Post -op Vital signs reviewed and stable  Post vital signs: Reviewed and stable  Complications: No apparent anesthesia complications

## 2012-05-13 NOTE — Anesthesia Preprocedure Evaluation (Addendum)
Anesthesia Evaluation  Patient identified by MRN, date of birth, ID band Patient awake    Reviewed: Allergy & Precautions, H&P , NPO status , Patient's Chart, lab work & pertinent test results, reviewed documented beta blocker date and time   Airway Mallampati: II TM Distance: >3 FB Neck ROM: full    Dental No notable dental hx.    Pulmonary neg pulmonary ROS,  breath sounds clear to auscultation  Pulmonary exam normal       Cardiovascular Exercise Tolerance: Good negative cardio ROS  Rhythm:regular Rate:Normal  Enlarged aorta, asymptomatic, no meds. ECG: Normal   Neuro/Psych negative neurological ROS  negative psych ROS   GI/Hepatic negative GI ROS, Neg liver ROS,   Endo/Other  negative endocrine ROS  Renal/GU negative Renal ROS  negative genitourinary   Musculoskeletal   Abdominal   Peds  Hematology negative hematology ROS (+)   Anesthesia Other Findings   Reproductive/Obstetrics negative OB ROS                          Anesthesia Physical Anesthesia Plan  ASA: I  Anesthesia Plan: General LMA   Post-op Pain Management:    Induction:   Airway Management Planned:   Additional Equipment:   Intra-op Plan:   Post-operative Plan:   Informed Consent: I have reviewed the patients History and Physical, chart, labs and discussed the procedure including the risks, benefits and alternatives for the proposed anesthesia with the patient or authorized representative who has indicated his/her understanding and acceptance.   Dental Advisory Given  Plan Discussed with: CRNA  Anesthesia Plan Comments:         Anesthesia Quick Evaluation

## 2012-05-13 NOTE — Op Note (Signed)
Procedure: Cystoscopy with right stent removal.  Preop Dx: Retained right ureteral stent.  Postop Dx: Same.  Surgeon: Bjorn Pippin MD.  Anesthesia:  General.  Specimen: Stent.  Comp: none.  Indication:  Retained right ureteral stent post recent right ureteroscopy.  Procedure:  He was taken to the OR and a general anesthetic was induced.  He was given Cipro.  He was placed in the lithotomy position, prepped with betadine and draped.  Cystoscopy was performed with the 57fr flexible scope.  The stent was visualized, grasped with graspers and removed.   There were no complications.  The anesthetic was reversed and he was taken to the PACU in stable condition.

## 2012-05-14 ENCOUNTER — Encounter (HOSPITAL_BASED_OUTPATIENT_CLINIC_OR_DEPARTMENT_OTHER): Payer: Self-pay | Admitting: Urology

## 2014-06-04 ENCOUNTER — Ambulatory Visit: Payer: BC Managed Care – PPO | Admitting: Podiatrist

## 2014-06-25 ENCOUNTER — Ambulatory Visit: Payer: BC Managed Care – PPO | Admitting: Podiatrist

## 2015-03-16 ENCOUNTER — Ambulatory Visit: Payer: Self-pay | Admitting: Orthopedic Surgery

## 2015-03-16 ENCOUNTER — Other Ambulatory Visit: Payer: Self-pay | Admitting: Orthopedic Surgery

## 2015-03-28 NOTE — Pre-Procedure Instructions (Signed)
11-14-14 EKG, on chart

## 2015-03-28 NOTE — Patient Instructions (Addendum)
Karis JubaRandy Lampi  03/28/2015   Your procedure is scheduled on: Wednesday 04/06/15  [facility] and follow signs to               Short Stay Center at 6:30AM.  Call this number if you have problems the morning of surgery [phone]   Remember: ONLY 1 PERSON MAY GO WITH YOU TO SHORT STAY TO GET  READY MORNING OF YOUR SURGERY.  Do not eat food or drink liquids :After Midnight.                                 You may not have any metal on your body including hair pins and              piercings  Do not wear jewelry, make-up, lotions, powders or perfumes, deodorant             Do not wear nail polish.  Do not shave  48 hours prior to surgery.              Men may shave face and neck.  Do not bring valuables to the hospital. [facility] IS NOT             RESPONSIBLE   FOR VALUABLES.  Contacts, dentures or bridgework may not be worn into surgery.  Leave suitcase in the car. After surgery it may be brought to your room.                Please read over the following fact sheets you were given: MRSA information  _____________________________________________________________________             [facility] - Pre[facility] Before surgery, you can play an important role.  Because skin is not sterile, your skin needs to be as free of germs as possible.  You can reduce the number of germs on your skin by washing with CHG (chlorahexidine gluconate) soap before surgery.  CHG is an antiseptic cleaner which kills germs and bonds with the skin to continue killing germs even after washing. Please DO NOT use if you have an allergy to CHG or antibacterial soaps.  If your skin becomes reddened/irritated stop using the CHG and inform your nurse when you arrive at Short Stay. Do not shave (including legs and underarms) for at least 48 hours prior to the first CHG shower.  You may shave your face/neck. Please follow these instructions carefully:  1.  Shower  with CHG Soap the night before surgery and the  morning of Surgery.  2.  If you choose to wash your hair, wash your hair first as usual with your  normal  shampoo.  3.  After you shampoo, rinse your hair and body thoroughly to remove the  shampoo.                            4.  Use CHG as you would any other liquid soap.  You can apply chg directly  to the skin and wash                       Gently with a scrungie or clean washcloth.  5.  Apply the CHG Soap to your body ONLY FROM THE NECK DOWN.  Do not use on face/ open                           Wound or open sores. Avoid contact with eyes, ears mouth and genitals (private parts).                       Wash face,  Genitals (private parts) with your normal soap.             6.  Wash thoroughly, paying special attention to the area where your surgery  will be performed.  7.  Thoroughly rinse your body with warm water from the neck down.  8.  DO NOT shower/wash with your normal soap after using and rinsing off  the CHG Soap.                9.  Pat yourself dry with a clean towel.            10.  Wear clean pajamas.            11.  Place clean sheets on your bed the night of your first shower and do not  sleep with pets. Day of Surgery : Do not apply any lotions/deodorants the morning of surgery.  Please wear clean clothes to the hospital/surgery center.  FAILURE TO FOLLOW THESE INSTRUCTIONS MAY RESULT IN THE CANCELLATION OF YOUR SURGERY PATIENT SIGNATURE_________________________________  NURSE SIGNATURE__________________________________  ________________________________________________________________________   Adam Phenix  An incentive spirometer is a tool that can help keep your lungs clear and active. This tool measures how well you are filling your lungs with each breath. Taking long deep breaths may help reverse or decrease the chance of developing breathing (pulmonary) problems (especially infection) following:  A long  period of time when you are unable to move or be active. BEFORE THE PROCEDURE   If the spirometer includes an indicator to show your best effort, your nurse or respiratory therapist will set it to a desired goal.  If possible, sit up straight or lean slightly forward. Try not to slouch.  Hold the incentive spirometer in an upright position. INSTRUCTIONS FOR USE  1. Sit on the edge of your bed if possible, or sit up as far as you can in bed or on a chair. 2. Hold the incentive spirometer in an upright position. 3. Breathe out normally. 4. Place the mouthpiece in your mouth and seal your lips tightly around it. 5. Breathe in slowly and as deeply as possible, raising the piston or the ball toward the top of the column. 6. Hold your breath for 3-5 seconds or for as long as possible. Allow the piston or ball to fall to the bottom of the column. 7. Remove the mouthpiece from your mouth and breathe out normally. 8. Rest for a few seconds and repeat Steps 1 through 7 at least 10 times every 1-2 hours when you are awake. Take your time and take a few normal breaths between deep breaths. 9. The spirometer may include an indicator to show your best effort. Use the indicator as a goal to work toward during each repetition. 10. After each set of 10 deep breaths, practice coughing to be sure your lungs are clear. If you have an incision (the cut made at the time of surgery), support your incision when coughing by placing a pillow or rolled up towels firmly against it. Once you are able to get out of  bed, walk around indoors and cough well. You may stop using the incentive spirometer when instructed by your caregiver.  RISKS AND COMPLICATIONS  Take your time so you do not get dizzy or light-headed.  If you are in pain, you may need to take or ask for pain medication before doing incentive spirometry. It is harder to take a deep breath if you are having pain. AFTER USE  Rest and breathe slowly and  easily.  It can be helpful to keep track of a log of your progress. Your caregiver can provide you with a simple table to help with this. If you are using the spirometer at home, follow these instructions: [place] IF:   You are having difficultly using the spirometer.  You have trouble using the spirometer as often as instructed.  Your pain medication is not giving enough relief while using the spirometer.  You develop fever of 100.5 F (38.1 C) or higher. SEEK IMMEDIATE MEDICAL CARE IF:   You cough up bloody sputum that had not been present before.  You develop fever of 102 F (38.9 C) or greater.  You develop worsening pain at or near the incision site. MAKE SURE YOU:   Understand these instructions.  Will watch your condition.  Will get help right away if you are not doing well or get worse. Document Released: 03/04/2007 Document Revised: 01/14/2012 Document Reviewed: 05/05/2007 [facility] Patient Information 2014 [place], [place].   ________________________________________________________________________

## 2015-03-29 ENCOUNTER — Ambulatory Visit (HOSPITAL_COMMUNITY)
Admission: RE | Admit: 2015-03-29 | Discharge: 2015-03-29 | Disposition: A | Discharge status: Home / Self Care | Payer: BLUE CROSS/BLUE SHIELD | Source: Ambulatory Visit | Attending: Orthopedic Surgery | Admitting: Orthopedic Surgery

## 2015-03-29 ENCOUNTER — Encounter (HOSPITAL_COMMUNITY): Payer: Self-pay

## 2015-03-29 ENCOUNTER — Encounter (HOSPITAL_COMMUNITY)
Admission: RE | Admit: 2015-03-29 | Discharge: 2015-03-29 | Disposition: A | Discharge status: Home / Self Care | Payer: BLUE CROSS/BLUE SHIELD | Source: Ambulatory Visit | Attending: Specialist | Admitting: Specialist

## 2015-03-29 DIAGNOSIS — M5136 Other intervertebral disc degeneration, lumbar region: Secondary | ICD-10-CM | POA: Diagnosis not present

## 2015-03-29 DIAGNOSIS — M5126 Other intervertebral disc displacement, lumbar region: Secondary | ICD-10-CM

## 2015-03-29 DIAGNOSIS — Z01818 Encounter for other preprocedural examination: Secondary | ICD-10-CM | POA: Insufficient documentation

## 2015-03-29 HISTORY — DX: Anemia, unspecified: D64.9

## 2015-03-29 HISTORY — DX: Bicuspid aortic valve: Q23.81

## 2015-03-29 HISTORY — DX: Headache, unspecified: R51.9

## 2015-03-29 HISTORY — DX: Major depressive disorder, single episode, unspecified: F32.9

## 2015-03-29 HISTORY — DX: Congenital insufficiency of aortic valve: Q23.1

## 2015-03-29 HISTORY — DX: Other intervertebral disc degeneration, lumbar region: M51.36

## 2015-03-29 HISTORY — DX: Depression, unspecified: F32.A

## 2015-03-29 HISTORY — DX: Headache: R51

## 2015-03-29 HISTORY — DX: Reserved for concepts with insufficient information to code with codable children: IMO0002

## 2015-03-29 HISTORY — DX: Other intervertebral disc degeneration, lumbar region without mention of lumbar back pain or lower extremity pain: M51.369

## 2015-03-29 HISTORY — DX: Hereditary elliptocytosis: D58.1

## 2015-03-29 LAB — BASIC METABOLIC PANEL
ANION GAP: 9 (ref 5–15)
BUN: 11 mg/dL (ref 6–20)
CO2: 27 mmol/L (ref 22–32)
Calcium: 9.2 mg/dL (ref 8.9–10.3)
Chloride: 105 mmol/L (ref 101–111)
Creatinine, Ser: 0.95 mg/dL (ref 0.61–1.24)
Glucose, Bld: 104 mg/dL — ABNORMAL HIGH (ref 65–99)
Potassium: 4.9 mmol/L (ref 3.5–5.1)
SODIUM: 141 mmol/L (ref 135–145)

## 2015-03-29 LAB — CBC
HCT: 41.3 % (ref 39.0–52.0)
Hemoglobin: 14.3 g/dL (ref 13.0–17.0)
MCH: 30.9 pg (ref 26.0–34.0)
MCHC: 34.6 g/dL (ref 30.0–36.0)
MCV: 89.2 fL (ref 78.0–100.0)
Platelets: 181 10*3/uL (ref 150–400)
RBC: 4.63 MIL/uL (ref 4.22–5.81)
RDW: 14.6 % (ref 11.5–15.5)
WBC: 3.9 10*3/uL — ABNORMAL LOW (ref 4.0–10.5)

## 2015-03-29 LAB — SURGICAL PCR SCREEN
MRSA, PCR: NEGATIVE
Staphylococcus aureus: NEGATIVE

## 2015-04-03 ENCOUNTER — Ambulatory Visit: Payer: Self-pay | Admitting: Orthopedic Surgery

## 2015-04-03 NOTE — H&P (Signed)
Austin Bolton is an 53 y.o. male.   Chief Complaint: back and L leg pain HPI: The patient is here today in referral from Marvin Rainwater, PA-C with [facility]. The patient reports low back symptoms including pain which began 9 month(s) ago without any known injury. Symptoms are reported to be located in the left low back and Symptoms include numbness (and tingling left foot). The pain radiates to the left buttock and left posterior thigh. The patient describes the pain as sharp and aching. The patient describes the severity of their symptoms as 10 / 10 on an analog pain scale. Symptoms are exacerbated by sitting. Current treatment includes nonsteroidal anti-inflammatory drugs and activity modification. Prior to being seen today the patient was previously evaluated by [facility]. Past treatment has included nonsteroidal anti-inflammatory drugs (Ibuprofen), epidural injections (X 1) and chiropractic manipulation.  He reports severe left lower extremity pain down to the back of his knee, buttock and into the calf. He has had intermittent back pain for a long period of time, but however, recently has had a severe left lower extremity radicular pain. He was sanding floors in 06/2014, I am reviewing his medical records. He has had an exacerbation where he was seen by Suzann Hedgecock, shoveling some snow. This was down in [place] and he sees Dr. Rainwater for pain management. He has recently had an MRI which indicated disc herniation at L5-S1, and he reports severe limitation in his activities of daily living. He has tried home exercises program, activity modification strategies to avoid re-injury but continued to report disabling buttock and leg pain. He had a functional rating index of 62.5% indicating severe functional disability. He has currently taken over-the-counter analgesics, he has seen a chiropractor. He has had no fevers or chills, change of bowel or bladder function,  pain awakes him at night or unexplained recent weight loss. 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  Past Medical History  Diagnosis Date  . History of kidney stones   . Enlarged aorta ASYMPTOMATIC--  NO MEDS  . History of concussion CHILD--  NO RESIDUAL  . Urgency of urination   . Nocturia   . Bicuspid aortic valve     "no problems"  . DDD (degenerative disc disease), lumbar   . HNP (herniated nucleus pulposus)     "at least one"  . Headache     hx of migraines, none recent  . Anemia     hx of  . Depression     no medication-years ago attempted suicide x1  . Elliptocytosis     Past Surgical History  Procedure Laterality Date  . Right ureteroscopic stone extraction / stent placement  05-08-2012   DR WRENN  . Ureterolithotomy  2001  . Knee surgery  1978    RIGHT  . Cystoscopy w/ ureteral stent removal  05/13/2012    Procedure: CYSTOSCOPY WITH STENT REMOVAL;  Surgeon: John J Wrenn, MD;  Location: [facility];  Service: Urology;  Laterality: Right;  flexible cystoscope  . Cholecystectomy  ~1992    Family History  Problem Relation Age of Onset  . Colon cancer Mother    Social History:  reports that he has never smoked. He has never used smokeless tobacco. He reports that he uses illicit drugs (Marijuana). He reports that he does not drink alcohol.  Allergies: No Known Allergies   (Not in a   hospital admission)  No results found for this or any previous visit (from the past 48 hour(s)). No results found.  Review of Systems  Constitutional: Negative.   HENT: Negative.   Eyes: Negative.   Respiratory: Negative.   Cardiovascular: Negative.   Gastrointestinal: Negative.   Genitourinary: Negative.   Musculoskeletal: Positive for back pain.  Skin: Negative.   Neurological: Positive for sensory change and focal weakness.  Psychiatric/Behavioral: Negative.     There were no vitals taken for this visit. Physical  Exam  Constitutional: He is oriented to person, place, and time. He appears well-developed and well-nourished.  HENT:  Head: Normocephalic and atraumatic.  Eyes: Conjunctivae and EOM are normal. Pupils are equal, round, and reactive to light.  Neck: Normal range of motion. Neck supple.  Cardiovascular: Normal rate and regular rhythm.   Respiratory: Effort normal and breath sounds normal.  GI: Soft. Bowel sounds are normal.  Musculoskeletal:  He is in severe distress. Mood and affect is anxious, standing. He has a normal lordosis. He has some tenderness in the left proximal gluteus, nontender over the lumbosacral junction, no flank pain on percussion, nontender over the thoracic spine. Straight leg raises buttock, thigh and calf pain on the left, negative on the right. He has EHL and plantar flexion weakness on the left compared to the right, hyperreflexic in the Achilles tendon bilaterally.  Lumbar spine exam reveals no evidence of soft tissue swelling, deformity or skin ecchymosis. On palpation there is no tenderness of the lumbar spine. No flank pain with percussion. The abdomen is soft and nontender. Nontender over the trochanters. No cellulitis or lymphadenopathy.  Good range of motion of the lumbar spine without associated pain. Motor is 5/5 including tibialis anterior, quadriceps and hamstrings. There is no Babinski or clonus. Sensory exam is intact to light touch. Patient has good distal pulses. No DVT. No pain and normal range of motion without instability of the hips, knees and ankles.  Neurological: He is alert and oriented to person, place, and time. He displays abnormal reflex.  Skin: Skin is warm and dry.    Three view x-rays, AP, lateral, flexion and extension demonstrates disc space narrowing at L5-S1, L4-5 and degenerative changes at L3-4. No instability in flexion and extension. Hips are unremarkable. No pars defect.  MRI 10/08/2014 from [facility] indicates a small  left-sided disc herniation with impingement on the left S1 nerve root. This was compared to 2008. The disc protrusion was felt to be slightly more compressive than the previous one.  The MRI does demonstrate disc degeneration at L3-4, L4-5 and L5-S1. Small disc protrusions or bulging at L3-4 and at L4-5 and a paracentral disc herniation at L5-S1 displacing and with apparent compression of the S1 nerve root.  Assessment/Plan HNP L5-S1 left 1. 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 2. Mild mechanical back pain secondary to disc degeneration at L5-S1, L4-5 and L3-4.  Extensive discussion with Austin Bolton and his wife concerning current pathology, relevant anatomy and treatment options. We had multiple questions and answers. We spent over 30 minutes discussing from this point just discussing the pathology, relevant anatomy and his treatment options. Certainly, an option at this point in time it is unclear as to whether he had temporary relief from a selective nerve root block but he has marked neural tension signs, certainly another injection at L5-S1 for diagnostic and therapeutic   purposes would be an option but certainly if he had no relief or temporary relief, one could consider a lumbar decompression at L5-S1 on the left to decompress the S1 nerve root to decrease his neuropathic pain as represented by his neural tension signs and radiculopathy. Specifically indicated that that would not address any back pain nor would it fix or cure the disc or the disc degeneration. The disc degeneration is present in the most all individuals over the age of 20, and with disc degeneration comes disc bulging but not necessarily symptoms as they typically are more related to neural compression. Discogenic pain is regulated by the pressure one places on the  disc and therefore after a decompression we discussed disc pressure management and residual back symptoms that would be treated conservatively with activity modification restrictions, etc. We did discuss specifically the possibility of recurrent disc herniation and the need for fusion in the future as a possibility. He has had this neural compression for a significant period of time and therefore there is a possibility that the decompression might not relieve his symptoms or that he would get full nerve recovery due to the duration of his symptoms, but certainly at this point continued compression is obviating that healing time. They would like to contemplate their options. They can call me back if they would like to proceed either with decompression or repeat epidural. We continue then conservative treatment, gabapentin may be an option as well as well as anti-inflammatory medications.  Plan microlumbar decompression L5-S1 left  BISSELL, [name] PA-C for Dr. Beane 04/03/2015, 11:21 AM    

## 2015-04-05 NOTE — Anesthesia Preprocedure Evaluation (Addendum)
Anesthesia Evaluation  Patient identified by MRN, date of birth, ID band Patient awake    Reviewed: Allergy & Precautions, NPO status , Patient's Chart, lab work & pertinent test results  History of Anesthesia Complications (+) PONVNegative for: history of anesthetic complications  Airway Mallampati: II  TM Distance: >3 FB Neck ROM: Full    Dental  (+) Teeth Intact, Dental Advisory Given   Pulmonary neg pulmonary ROS,    Pulmonary exam normal       Cardiovascular + Peripheral Vascular Disease Normal cardiovascular exam    Neuro/Psych  Headaches, PSYCHIATRIC DISORDERS Depression    GI/Hepatic negative GI ROS, Neg liver ROS,   Endo/Other  negative endocrine ROS  Renal/GU negative Renal ROS     Musculoskeletal   Abdominal   Peds  Hematology   Anesthesia Other Findings   Reproductive/Obstetrics                            Anesthesia Physical Anesthesia Plan  ASA: II  Anesthesia Plan: General   Post-op Pain Management:    Induction: Intravenous  Airway Management Planned: Oral ETT  Additional Equipment:   Intra-op Plan:   Post-operative Plan: Extubation in OR  Informed Consent: I have reviewed the patients History and Physical, chart, labs and discussed the procedure including the risks, benefits and alternatives for the proposed anesthesia with the patient or authorized representative who has indicated his/her understanding and acceptance.   Dental advisory given  Plan Discussed with: CRNA, Anesthesiologist and Surgeon  Anesthesia Plan Comments:        Anesthesia Quick Evaluation

## 2015-04-06 ENCOUNTER — Ambulatory Visit (HOSPITAL_COMMUNITY): Payer: BLUE CROSS/BLUE SHIELD

## 2015-04-06 ENCOUNTER — Encounter (HOSPITAL_COMMUNITY): Payer: Self-pay | Admitting: *Deleted

## 2015-04-06 ENCOUNTER — Ambulatory Visit (HOSPITAL_COMMUNITY): Payer: BLUE CROSS/BLUE SHIELD | Admitting: Anesthesiology

## 2015-04-06 ENCOUNTER — Encounter (HOSPITAL_COMMUNITY)
Admission: RE | Disposition: A | Discharge status: Home / Self Care | Payer: Self-pay | Source: Ambulatory Visit | Attending: Specialist

## 2015-04-06 ENCOUNTER — Ambulatory Visit (HOSPITAL_COMMUNITY)
Admission: RE | Admit: 2015-04-06 | Discharge: 2015-04-07 | Disposition: A | Discharge status: Home / Self Care | Payer: BLUE CROSS/BLUE SHIELD | Source: Ambulatory Visit | Attending: Specialist | Admitting: Specialist

## 2015-04-06 DIAGNOSIS — F329 Major depressive disorder, single episode, unspecified: Secondary | ICD-10-CM | POA: Diagnosis not present

## 2015-04-06 DIAGNOSIS — Z87442 Personal history of urinary calculi: Secondary | ICD-10-CM | POA: Diagnosis not present

## 2015-04-06 DIAGNOSIS — M5126 Other intervertebral disc displacement, lumbar region: Secondary | ICD-10-CM | POA: Diagnosis present

## 2015-04-06 DIAGNOSIS — D649 Anemia, unspecified: Secondary | ICD-10-CM | POA: Insufficient documentation

## 2015-04-06 DIAGNOSIS — G43909 Migraine, unspecified, not intractable, without status migrainosus: Secondary | ICD-10-CM | POA: Insufficient documentation

## 2015-04-06 DIAGNOSIS — M5127 Other intervertebral disc displacement, lumbosacral region: Secondary | ICD-10-CM | POA: Diagnosis present

## 2015-04-06 DIAGNOSIS — D581 Hereditary elliptocytosis: Secondary | ICD-10-CM | POA: Diagnosis not present

## 2015-04-06 DIAGNOSIS — M4807 Spinal stenosis, lumbosacral region: Secondary | ICD-10-CM | POA: Insufficient documentation

## 2015-04-06 DIAGNOSIS — Z419 Encounter for procedure for purposes other than remedying health state, unspecified: Secondary | ICD-10-CM

## 2015-04-06 HISTORY — PX: LUMBAR LAMINECTOMY/DECOMPRESSION MICRODISCECTOMY: SHX5026

## 2015-04-06 SURGERY — LUMBAR LAMINECTOMY/DECOMPRESSION MICRODISCECTOMY 1 LEVEL
Anesthesia: General | Laterality: Left

## 2015-04-06 MED ORDER — NEOSTIGMINE METHYLSULFATE 10 MG/10ML IV SOLN
INTRAVENOUS | Status: AC
  Filled 2015-04-06: qty 1

## 2015-04-06 MED ORDER — ROCURONIUM BROMIDE 100 MG/10ML IV SOLN
INTRAVENOUS | Status: DC | PRN
  Administered 2015-04-06: 5 mg via INTRAVENOUS
  Administered 2015-04-06: 30 mg via INTRAVENOUS
  Administered 2015-04-06: 10 mg via INTRAVENOUS

## 2015-04-06 MED ORDER — LIDOCAINE HCL (CARDIAC) 20 MG/ML IV SOLN
INTRAVENOUS | Status: DC | PRN
  Administered 2015-04-06: 100 mg via INTRAVENOUS

## 2015-04-06 MED ORDER — PROPOFOL 10 MG/ML IV BOLUS
INTRAVENOUS | Status: DC | PRN
  Administered 2015-04-06: 200 mg via INTRAVENOUS

## 2015-04-06 MED ORDER — OXYCODONE-ACETAMINOPHEN 5-325 MG PO TABS
1.0000 | ORAL_TABLET | ORAL | Status: DC | PRN
Start: 2015-04-06 — End: 2015-11-22

## 2015-04-06 MED ORDER — MIDAZOLAM HCL 2 MG/2ML IJ SOLN
INTRAMUSCULAR | Status: AC
  Filled 2015-04-06: qty 2

## 2015-04-06 MED ORDER — PHENOL 1.4 % MT LIQD: 1.0000 | OROMUCOSAL | Status: DC | PRN

## 2015-04-06 MED ORDER — MENTHOL 3 MG MT LOZG: 1.0000 | LOZENGE | OROMUCOSAL | Status: DC | PRN

## 2015-04-06 MED ORDER — LACTATED RINGERS IV SOLN
INTRAVENOUS | Status: DC
  Administered 2015-04-06 (×2): via INTRAVENOUS

## 2015-04-06 MED ORDER — LIDOCAINE HCL (CARDIAC) 20 MG/ML IV SOLN
INTRAVENOUS | Status: AC
  Filled 2015-04-06: qty 5

## 2015-04-06 MED ORDER — ONDANSETRON HCL 4 MG/2ML IJ SOLN
INTRAMUSCULAR | Status: DC | PRN
  Administered 2015-04-06: 4 mg via INTRAVENOUS

## 2015-04-06 MED ORDER — PROPOFOL 10 MG/ML IV BOLUS
INTRAVENOUS | Status: AC
  Filled 2015-04-06: qty 20

## 2015-04-06 MED ORDER — BUPIVACAINE-EPINEPHRINE (PF) 0.5% -1:200000 IJ SOLN
INTRAMUSCULAR | Status: AC
  Filled 2015-04-06: qty 30

## 2015-04-06 MED ORDER — METOCLOPRAMIDE HCL 5 MG/ML IJ SOLN
5.0000 mg | Freq: Four times a day (QID) | INTRAMUSCULAR | Status: DC | PRN
  Administered 2015-04-06: 10 mg via INTRAVENOUS
  Filled 2015-04-06: qty 2

## 2015-04-06 MED ORDER — HYDROCODONE-ACETAMINOPHEN 5-325 MG PO TABS
1.0000 | ORAL_TABLET | ORAL | Status: DC | PRN
  Administered 2015-04-06: 1 via ORAL
  Administered 2015-04-07 (×2): 2 via ORAL
  Filled 2015-04-06: qty 2
  Filled 2015-04-06: qty 1
  Filled 2015-04-06: qty 2

## 2015-04-06 MED ORDER — KCL IN DEXTROSE-NACL 20-5-0.45 MEQ/L-%-% IV SOLN
INTRAVENOUS | Status: DC
  Administered 2015-04-06: 14:00:00 via INTRAVENOUS
  Filled 2015-04-06 (×2): qty 1000

## 2015-04-06 MED ORDER — METHOCARBAMOL 500 MG PO TABS: 500.0000 mg | ORAL_TABLET | Freq: Three times a day (TID) | ORAL | Status: DC | PRN

## 2015-04-06 MED ORDER — ONDANSETRON HCL 4 MG/2ML IJ SOLN
INTRAMUSCULAR | Status: AC
  Filled 2015-04-06: qty 2

## 2015-04-06 MED ORDER — HYOSCYAMINE SULFATE 0.125 MG SL SUBL: 0.1250 mg | SUBLINGUAL_TABLET | SUBLINGUAL | Status: DC | PRN

## 2015-04-06 MED ORDER — SENNOSIDES-DOCUSATE SODIUM 8.6-50 MG PO TABS: 1.0000 | ORAL_TABLET | Freq: Every evening | ORAL | Status: DC | PRN

## 2015-04-06 MED ORDER — DEXAMETHASONE SODIUM PHOSPHATE 10 MG/ML IJ SOLN
INTRAMUSCULAR | Status: AC
  Filled 2015-04-06: qty 1

## 2015-04-06 MED ORDER — PROMETHAZINE HCL 25 MG/ML IJ SOLN
6.2500 mg | INTRAMUSCULAR | Status: DC | PRN
Start: 2015-04-06 — End: 2015-04-06

## 2015-04-06 MED ORDER — RISAQUAD PO CAPS
1.0000 | ORAL_CAPSULE | Freq: Every day | ORAL | Status: DC
Start: 2015-04-06 — End: 2015-04-07
  Administered 2015-04-07: 1 via ORAL
  Filled 2015-04-06 (×2): qty 1

## 2015-04-06 MED ORDER — ROCURONIUM BROMIDE 100 MG/10ML IV SOLN
INTRAVENOUS | Status: AC
  Filled 2015-04-06: qty 1

## 2015-04-06 MED ORDER — SODIUM CHLORIDE 0.9 % IR SOLN
Status: DC | PRN
  Administered 2015-04-06: 500 mL

## 2015-04-06 MED ORDER — GLYCOPYRROLATE 0.2 MG/ML IJ SOLN
INTRAMUSCULAR | Status: AC
  Filled 2015-04-06: qty 3

## 2015-04-06 MED ORDER — HYDROMORPHONE HCL 1 MG/ML IJ SOLN
INTRAMUSCULAR | Status: AC
  Filled 2015-04-06: qty 1

## 2015-04-06 MED ORDER — ONDANSETRON HCL 4 MG/2ML IJ SOLN
4.0000 mg | INTRAMUSCULAR | Status: DC | PRN
  Administered 2015-04-06: 4 mg via INTRAVENOUS
  Filled 2015-04-06: qty 2

## 2015-04-06 MED ORDER — FENTANYL CITRATE (PF) 250 MCG/5ML IJ SOLN
INTRAMUSCULAR | Status: AC
  Filled 2015-04-06: qty 5

## 2015-04-06 MED ORDER — PROMETHAZINE HCL 25 MG/ML IJ SOLN
12.5000 mg | Freq: Four times a day (QID) | INTRAMUSCULAR | Status: DC | PRN
  Administered 2015-04-06: 12.5 mg via INTRAVENOUS
  Filled 2015-04-06: qty 1

## 2015-04-06 MED ORDER — CEFAZOLIN SODIUM-DEXTROSE 2-3 GM-% IV SOLR
2.0000 g | Freq: Three times a day (TID) | INTRAVENOUS | Status: AC
  Administered 2015-04-06 – 2015-04-07 (×3): 2 g via INTRAVENOUS
  Filled 2015-04-06 (×3): qty 50

## 2015-04-06 MED ORDER — DOCUSATE SODIUM 100 MG PO CAPS
100.0000 mg | ORAL_CAPSULE | Freq: Two times a day (BID) | ORAL | Status: DC
  Administered 2015-04-07 (×2): 100 mg via ORAL

## 2015-04-06 MED ORDER — NEOSTIGMINE METHYLSULFATE 10 MG/10ML IV SOLN
INTRAVENOUS | Status: DC | PRN
  Administered 2015-04-06: 5 mg via INTRAVENOUS

## 2015-04-06 MED ORDER — SUCCINYLCHOLINE CHLORIDE 20 MG/ML IJ SOLN
INTRAMUSCULAR | Status: DC | PRN
  Administered 2015-04-06: 100 mg via INTRAVENOUS

## 2015-04-06 MED ORDER — SODIUM CHLORIDE 0.9 % IR SOLN
Status: AC
  Filled 2015-04-06: qty 1

## 2015-04-06 MED ORDER — ACETAMINOPHEN 650 MG RE SUPP: 650.0000 mg | RECTAL | Status: DC | PRN

## 2015-04-06 MED ORDER — PROMETHAZINE HCL 25 MG PO TABS: 12.5000 mg | ORAL_TABLET | Freq: Four times a day (QID) | ORAL | Status: DC | PRN

## 2015-04-06 MED ORDER — DOCUSATE SODIUM 100 MG PO CAPS: 100.0000 mg | ORAL_CAPSULE | Freq: Two times a day (BID) | ORAL | Status: DC | PRN

## 2015-04-06 MED ORDER — HYDROMORPHONE HCL 1 MG/ML IJ SOLN
0.2500 mg | INTRAMUSCULAR | Status: DC | PRN
  Administered 2015-04-06 (×2): 0.5 mg via INTRAVENOUS

## 2015-04-06 MED ORDER — CEFAZOLIN SODIUM-DEXTROSE 2-3 GM-% IV SOLR
2.0000 g | INTRAVENOUS | Status: AC
  Administered 2015-04-06: 2 g via INTRAVENOUS

## 2015-04-06 MED ORDER — MIDAZOLAM HCL 5 MG/5ML IJ SOLN
INTRAMUSCULAR | Status: DC | PRN
  Administered 2015-04-06: 2 mg via INTRAVENOUS

## 2015-04-06 MED ORDER — BUPIVACAINE-EPINEPHRINE 0.5% -1:200000 IJ SOLN
INTRAMUSCULAR | Status: DC | PRN
  Administered 2015-04-06: 17 mL

## 2015-04-06 MED ORDER — FENTANYL CITRATE (PF) 100 MCG/2ML IJ SOLN
INTRAMUSCULAR | Status: DC | PRN
  Administered 2015-04-06 (×2): 50 ug via INTRAVENOUS

## 2015-04-06 MED ORDER — ALUM & MAG HYDROXIDE-SIMETH 200-200-20 MG/5ML PO SUSP: 30.0000 mL | Freq: Four times a day (QID) | ORAL | Status: DC | PRN

## 2015-04-06 MED ORDER — BISACODYL 5 MG PO TBEC: 5.0000 mg | DELAYED_RELEASE_TABLET | Freq: Every day | ORAL | Status: DC | PRN

## 2015-04-06 MED ORDER — DEXAMETHASONE SODIUM PHOSPHATE 10 MG/ML IJ SOLN
INTRAMUSCULAR | Status: DC | PRN
  Administered 2015-04-06: 10 mg via INTRAVENOUS

## 2015-04-06 MED ORDER — METHOCARBAMOL 500 MG PO TABS
500.0000 mg | ORAL_TABLET | Freq: Four times a day (QID) | ORAL | Status: DC | PRN
  Administered 2015-04-07: 500 mg via ORAL
  Filled 2015-04-06: qty 1

## 2015-04-06 MED ORDER — GLYCOPYRROLATE 0.2 MG/ML IJ SOLN
INTRAMUSCULAR | Status: DC | PRN
  Administered 2015-04-06: 0.6 mg via INTRAVENOUS

## 2015-04-06 MED ORDER — METHOCARBAMOL 1000 MG/10ML IJ SOLN
500.0000 mg | Freq: Four times a day (QID) | INTRAVENOUS | Status: DC | PRN
  Administered 2015-04-06: 500 mg via INTRAVENOUS
  Filled 2015-04-06 (×2): qty 5

## 2015-04-06 MED ORDER — HYDROMORPHONE HCL 1 MG/ML IJ SOLN: 0.5000 mg | INTRAMUSCULAR | Status: DC | PRN

## 2015-04-06 MED ORDER — CEFAZOLIN SODIUM-DEXTROSE 2-3 GM-% IV SOLR
INTRAVENOUS | Status: AC
  Filled 2015-04-06: qty 50

## 2015-04-06 MED ORDER — THROMBIN 5000 UNITS EX SOLR
CUTANEOUS | Status: AC
  Filled 2015-04-06: qty 10000

## 2015-04-06 MED ORDER — MAGNESIUM CITRATE PO SOLN: 1.0000 | Freq: Once | ORAL | Status: AC | PRN

## 2015-04-06 MED ORDER — OXYCODONE-ACETAMINOPHEN 5-325 MG PO TABS
1.0000 | ORAL_TABLET | ORAL | Status: DC | PRN
  Administered 2015-04-07: 2 via ORAL
  Filled 2015-04-06: qty 2

## 2015-04-06 MED ORDER — ACETAMINOPHEN 325 MG PO TABS
650.0000 mg | ORAL_TABLET | ORAL | Status: DC | PRN
  Administered 2015-04-06: 650 mg via ORAL
  Filled 2015-04-06: qty 2

## 2015-04-06 MED ORDER — TAMSULOSIN HCL 0.4 MG PO CAPS: 0.4000 mg | ORAL_CAPSULE | Freq: Every day | ORAL | Status: DC

## 2015-04-06 SURGICAL SUPPLY — 47 items
BAG SPEC THK2 15X12 ZIP CLS (MISCELLANEOUS)
BAG ZIPLOCK 12X15 (MISCELLANEOUS) IMPLANT
CLEANER TIP ELECTROSURG 2X2 (MISCELLANEOUS) ×3 IMPLANT
CLOSURE WOUND 1/2 X4 (GAUZE/BANDAGES/DRESSINGS) ×1
CLOTH 2% CHLOROHEXIDINE 3PK (PERSONAL CARE ITEMS) ×3 IMPLANT
DRAPE MICROSCOPE LEICA (MISCELLANEOUS) ×3 IMPLANT
DRAPE POUCH INSTRU U-SHP 10X18 (DRAPES) ×3 IMPLANT
DRAPE SHEET LG 3/4 BI-LAMINATE (DRAPES) ×3 IMPLANT
DRAPE SURG 17X11 SM STRL (DRAPES) ×3 IMPLANT
DRAPE UTILITY XL STRL (DRAPES) ×3 IMPLANT
DRSG AQUACEL AG ADV 3.5X 4 (GAUZE/BANDAGES/DRESSINGS) ×2 IMPLANT
DRSG AQUACEL AG ADV 3.5X 6 (GAUZE/BANDAGES/DRESSINGS) IMPLANT
DURAPREP 26ML APPLICATOR (WOUND CARE) ×3 IMPLANT
DURASEAL SPINE SEALANT 3ML (MISCELLANEOUS) IMPLANT
ELECT BLADE TIP CTD 4 INCH (ELECTRODE) IMPLANT
ELECT REM PT RETURN 9FT ADLT (ELECTROSURGICAL) ×3
ELECTRODE REM PT RTRN 9FT ADLT (ELECTROSURGICAL) ×1 IMPLANT
GLOVE BIOGEL PI IND STRL 7.5 (GLOVE) ×1 IMPLANT
GLOVE BIOGEL PI INDICATOR 7.5 (GLOVE) ×2
GLOVE SURG SS PI 7.5 STRL IVOR (GLOVE) ×3 IMPLANT
GLOVE SURG SS PI 8.0 STRL IVOR (GLOVE) ×6 IMPLANT
GOWN STRL REUS W/TWL XL LVL3 (GOWN DISPOSABLE) ×6 IMPLANT
IV CATH 14GX2 1/4 (CATHETERS) IMPLANT
KIT BASIN OR (CUSTOM PROCEDURE TRAY) ×3 IMPLANT
KIT POSITIONING SURG ANDREWS (MISCELLANEOUS) ×3 IMPLANT
MANIFOLD NEPTUNE II (INSTRUMENTS) ×3 IMPLANT
NDL SPNL 18GX3.5 QUINCKE PK (NEEDLE) ×2 IMPLANT
NEEDLE SPNL 18GX3.5 QUINCKE PK (NEEDLE) ×6 IMPLANT
PACK LAMINECTOMY ORTHO (CUSTOM PROCEDURE TRAY) ×3 IMPLANT
PATTIES SURGICAL .5 X.5 (GAUZE/BANDAGES/DRESSINGS) IMPLANT
PATTIES SURGICAL .75X.75 (GAUZE/BANDAGES/DRESSINGS) IMPLANT
PATTIES SURGICAL 1X1 (DISPOSABLE) IMPLANT
SPONGE SURGIFOAM ABS GEL 100 (HEMOSTASIS) ×3 IMPLANT
STAPLER VISISTAT (STAPLE) IMPLANT
STRIP CLOSURE SKIN 1/2X4 (GAUZE/BANDAGES/DRESSINGS) ×2 IMPLANT
SUT NURALON 4 0 TR CR/8 (SUTURE) IMPLANT
SUT PROLENE 3 0 PS 2 (SUTURE) ×3 IMPLANT
SUT VIC AB 1 CT1 27 (SUTURE)
SUT VIC AB 1 CT1 27XBRD ANTBC (SUTURE) IMPLANT
SUT VIC AB 1-0 CT2 27 (SUTURE) ×3 IMPLANT
SUT VIC AB 2-0 CT1 27 (SUTURE)
SUT VIC AB 2-0 CT1 TAPERPNT 27 (SUTURE) IMPLANT
SUT VIC AB 2-0 CT2 27 (SUTURE) ×3 IMPLANT
SYR 3ML LL SCALE MARK (SYRINGE) IMPLANT
TOWEL OR 17X26 10 PK STRL BLUE (TOWEL DISPOSABLE) ×3 IMPLANT
TOWEL OR NON WOVEN STRL DISP B (DISPOSABLE) IMPLANT
YANKAUER SUCT BULB TIP NO VENT (SUCTIONS) IMPLANT

## 2015-04-06 NOTE — Transfer of Care (Signed)
Immediate Anesthesia Transfer of Care Note  Patient: Austin Bolton  Procedure(s) Performed: Procedure(s): MICRO LUMBAR DECOMPRESSION  L5-S1 ON LEFT (Left)  Patient Location: PACU  Anesthesia Type:General  Level of Consciousness: sedated  Airway & Oxygen Therapy: Patient Spontanous Breathing and Patient connected to face mask oxygen  Post-op Assessment: Report given to RN and Post -op Vital signs reviewed and stable  Post vital signs: Reviewed and stable  Last Vitals:  Filed Vitals:   04/06/15 0632  BP: 135/91  Pulse: 71  Temp: 36.5 C  Resp: 18    Complications: No apparent anesthesia complications

## 2015-04-06 NOTE — Interval H&P Note (Signed)
History and Physical Interval Note:  04/06/2015 8:22 AM  Austin Bolton  has presented today for surgery, with the diagnosis of HNP L5-S1 ON LEFT  The various methods of treatment have been discussed with the patient and family. After consideration of risks, benefits and other options for treatment, the patient has consented to  Procedure(s): MICRO LUMBAR DECOMPRESSION  L5-S1 ON LEFT (Left) as a surgical intervention .  The patient's history has been reviewed, patient examined, no change in status, stable for surgery.  I have reviewed the patient's chart and labs.  Questions were answered to the patient's satisfaction.     Clebert Wenger C

## 2015-04-06 NOTE — Progress Notes (Signed)
Pt has bite on right 2nd finger from parrot (pet bird in their home) skin is broken, pt has covered with bandaid. [place]

## 2015-04-06 NOTE — Anesthesia Procedure Notes (Signed)
Procedure Name: Intubation Date/Time: 04/06/2015 8:29 AM Performed by: Doran ClayALDAY, Akeira Lahm R Pre-anesthesia Checklist: Patient identified, Emergency Drugs available, Suction available, Patient being monitored and Timeout performed Patient Re-evaluated:Patient Re-evaluated prior to inductionOxygen Delivery Method: Circle system utilized Preoxygenation: Pre-oxygenation with 100% oxygen Intubation Type: IV induction Ventilation: Mask ventilation without difficulty Laryngoscope Size: Mac and 4 Grade View: Grade II Tube type: Oral Tube size: 7.5 mm Number of attempts: 1 Airway Equipment and Method: Stylet Placement Confirmation: ETT inserted through vocal cords under direct vision,  positive ETCO2 and breath sounds checked- equal and bilateral Secured at: 22 cm Tube secured with: Tape Dental Injury: Teeth and Oropharynx as per pre-operative assessment

## 2015-04-06 NOTE — Discharge Instructions (Signed)
Walk As Tolerated utilizing back precautions.  No bending, twisting, or lifting.  No driving for 2 weeks.   °Aquacel dressing may remain in place until follow up. May shower with aquacel dressing in place. If the dressing peels off or becomes saturated, you may remove aquacel dressing and place gauze and tape dressing which should be kept clean and dry and changed daily. Do not remove steri-strips if they are present. °See Dr. Mozetta Murfin in office in 10 to 14 days. Begin taking aspirin 81mg per day starting 4 days after your surgery if not allergic to aspirin or on another blood thinner. °Walk daily even outside. Use a cane or walker only if necessary. °Avoid sitting on soft sofas. ° °

## 2015-04-06 NOTE — Brief Op Note (Signed)
04/06/2015  9:55 AM  PATIENT:  Karis Jubaandy Crunkleton  54 y.o. 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  PROCEDURE:  Procedure(s): MICRO LUMBAR DECOMPRESSION  L5-S1 ON LEFT (Left)  SURGEON:  Surgeon(s) and Role:    * Jene EveryJeffrey Lexi Conaty, MD - Primary  PHYSICIAN ASSISTANT:   ASSISTANTS: Bissell   ANESTHESIA:   general  EBL:  Total I/O In: 1000 [I.V.:1000] Out: -   BLOOD ADMINISTERED:none  DRAINS: none   LOCAL MEDICATIONS USED:  MARCAINE     SPECIMEN:  Source of Specimen:  L5S1  DISPOSITION OF SPECIMEN:  PATHOLOGY  COUNTS:  YES and ACTION TAKEN: None  TOURNIQUET:  * No tourniquets in log *  DICTATION: .Other Dictation: Dictation Number [id number]  PLAN OF CARE: Admit for overnight observation  PATIENT DISPOSITION:  PACU - hemodynamically stable.   Delay start of Pharmacological VTE agent (>24hrs) due to surgical blood loss or risk of bleeding: yes

## 2015-04-06 NOTE — H&P (View-Only) (Signed)
Austin Bolton is an 54 y.o. male.   Chief Complaint: back and L leg pain HPI: The patient is here today in referral from Konrad Penta, PA-C with [facility]. The patient reports low back symptoms including pain which began 9 month(s) ago without any known injury. Symptoms are reported to be located in the left low back and Symptoms include numbness (and tingling left foot). The pain radiates to the left buttock and left posterior thigh. The patient describes the pain as sharp and aching. The patient describes the severity of their symptoms as 10 / 10 on an analog pain scale. Symptoms are exacerbated by sitting. Current treatment includes nonsteroidal anti-inflammatory drugs and activity modification. Prior to being seen today the patient was previously evaluated by [facility]. Past treatment has included nonsteroidal anti-inflammatory drugs (Ibuprofen), epidural injections (X 1) and chiropractic manipulation.  He reports severe left lower extremity pain down to the back of his knee, buttock and into the calf. He has had intermittent back pain for a long period of time, but however, recently has had a severe left lower extremity radicular pain. He was sanding floors in 06/2014, I am reviewing his medical records. He has had an exacerbation where he was seen by [facility], shoveling some snow. This was down in [facility] and he sees Dr. Genella Mech for pain management. He has recently had an MRI which indicated disc herniation at L5-S1, and he reports severe limitation in his activities of daily living. He has tried home exercises program, activity modification strategies to avoid re-injury but continued to report disabling buttock and leg pain. He had a functional rating index of 62.5% indicating severe functional disability. He has currently taken over-the-counter analgesics, he has seen a Land. He has had no fevers or chills, change of bowel or bladder function,  pain awakes him at night or unexplained recent weight loss. 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  Past Medical History  Diagnosis Date  . History of kidney stones   . Enlarged aorta ASYMPTOMATIC--  NO MEDS  . History of concussion CHILD--  NO RESIDUAL  . Urgency of urination   . Nocturia   . Bicuspid aortic valve     "no problems"  . DDD (degenerative disc disease), lumbar   . HNP (herniated nucleus pulposus)     "at least one"  . Headache     hx of migraines, none recent  . Anemia     hx of  . Depression     no medication-years ago attempted suicide x1  . Elliptocytosis     Past Surgical History  Procedure Laterality Date  . Right ureteroscopic stone extraction / stent placement  05-08-2012   [facility]  . Ureterolithotomy  2001  . Knee surgery  1978    RIGHT  . Cystoscopy w/ ureteral stent removal  05/13/2012    Procedure: CYSTOSCOPY WITH STENT REMOVAL;  Surgeon: Anner Crete, MD;  Location: [facility];  Service: Urology;  Laterality: Right;  flexible cystoscope  . Cholecystectomy  ~1992    Family History  Problem Relation Age of Onset  . Colon cancer Mother    Social History:  reports that he has never smoked. He has never used smokeless tobacco. He reports that he uses illicit drugs (Marijuana). He reports that he does not drink alcohol.  Allergies: No Known Allergies   (Not in a  hospital admission)  No results found for this or any previous visit (from the past 48 hour(s)). No results found.  Review of Systems  Constitutional: Negative.   HENT: Negative.   Eyes: Negative.   Respiratory: Negative.   Cardiovascular: Negative.   Gastrointestinal: Negative.   Genitourinary: Negative.   Musculoskeletal: Positive for back pain.  Skin: Negative.   Neurological: Positive for sensory change and focal weakness.  Psychiatric/Behavioral: Negative.     There were no vitals taken for this visit. Physical  Exam  Constitutional: He is oriented to person, place, and time. He appears well-developed and well-nourished.  HENT:  Head: Normocephalic and atraumatic.  Eyes: Conjunctivae and EOM are normal. Pupils are equal, round, and reactive to light.  Neck: Normal range of motion. Neck supple.  Cardiovascular: Normal rate and regular rhythm.   Respiratory: Effort normal and breath sounds normal.  GI: Soft. Bowel sounds are normal.  Musculoskeletal:  He is in severe distress. Mood and affect is anxious, standing. He has a normal lordosis. He has some tenderness in the left proximal gluteus, nontender over the lumbosacral junction, no flank pain on percussion, nontender over the thoracic spine. Straight leg raises buttock, thigh and calf pain on the left, negative on the right. He has EHL and plantar flexion weakness on the left compared to the right, hyperreflexic in the Achilles tendon bilaterally.  Lumbar spine exam reveals no evidence of soft tissue swelling, deformity or skin ecchymosis. On palpation there is no tenderness of the lumbar spine. No flank pain with percussion. The abdomen is soft and nontender. Nontender over the trochanters. No cellulitis or lymphadenopathy.  Good range of motion of the lumbar spine without associated pain. Motor is 5/5 including tibialis anterior, quadriceps and hamstrings. There is no Babinski or clonus. Sensory exam is intact to light touch. Patient has good distal pulses. No DVT. No pain and normal range of motion without instability of the hips, knees and ankles.  Neurological: He is alert and oriented to person, place, and time. He displays abnormal reflex.  Skin: Skin is warm and dry.    Three view x-rays, AP, lateral, flexion and extension demonstrates disc space narrowing at L5-S1, L4-5 and degenerative changes at L3-4. No instability in flexion and extension. Hips are unremarkable. No pars defect.  MRI 10/08/2014 from [facility] indicates a small  left-sided disc herniation with impingement on the left S1 nerve root. This was compared to 2008. The disc protrusion was felt to be slightly more compressive than the previous one.  The MRI does demonstrate disc degeneration at L3-4, L4-5 and L5-S1. Small disc protrusions or bulging at L3-4 and at L4-5 and a paracentral disc herniation at L5-S1 displacing and with apparent compression of the S1 nerve root.  Assessment/Plan HNP L5-S1 left 1. 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 2. Mild mechanical back pain secondary to disc degeneration at L5-S1, L4-5 and L3-4.  Extensive discussion with Mr. Balderston and his wife concerning current pathology, relevant anatomy and treatment options. We had multiple questions and answers. We spent over 30 minutes discussing from this point just discussing the pathology, relevant anatomy and his treatment options. Certainly, an option at this point in time it is unclear as to whether he had temporary relief from a selective nerve root block but he has marked neural tension signs, certainly another injection at L5-S1 for diagnostic and therapeutic  purposes would be an option but certainly if he had no relief or temporary relief, one could consider a lumbar decompression at L5-S1 on the left to decompress the S1 nerve root to decrease his neuropathic pain as represented by his neural tension signs and radiculopathy. Specifically indicated that that would not address any back pain nor would it fix or cure the disc or the disc degeneration. The disc degeneration is present in the most all individuals over the age of 55, and with disc degeneration comes disc bulging but not necessarily symptoms as they typically are more related to neural compression. Discogenic pain is regulated by the pressure one places on the  disc and therefore after a decompression we discussed disc pressure management and residual back symptoms that would be treated conservatively with activity modification restrictions, etc. We did discuss specifically the possibility of recurrent disc herniation and the need for fusion in the future as a possibility. He has had this neural compression for a significant period of time and therefore there is a possibility that the decompression might not relieve his symptoms or that he would get full nerve recovery due to the duration of his symptoms, but certainly at this point continued compression is obviating that healing time. They would like to contemplate their options. They can call me back if they would like to proceed either with decompression or repeat epidural. We continue then conservative treatment, gabapentin may be an option as well as well as anti-inflammatory medications.  Plan microlumbar decompression L5-S1 left  BISSELL, [name] PA-C for Dr. Shelle IronBeane 04/03/2015, 11:21 AM

## 2015-04-06 NOTE — Anesthesia Postprocedure Evaluation (Signed)
Anesthesia Post Note  Patient: Austin JubaRandy Bratcher  Procedure(s) Performed: Procedure(s) (LRB): MICRO LUMBAR DECOMPRESSION  L5-S1 ON LEFT (Left)  Anesthesia type: general  Patient location: PACU  Post pain: Pain level controlled  Post assessment: Patient's Cardiovascular Status Stable  Last Vitals:  Filed Vitals:   04/06/15 1129  BP: 123/87  Pulse: 66  Temp: 36.6 C  Resp: 14    Post vital signs: Reviewed and stable  Level of consciousness: sedated  Complications: No apparent anesthesia complications

## 2015-04-07 ENCOUNTER — Encounter (HOSPITAL_COMMUNITY): Payer: Self-pay | Admitting: Specialist

## 2015-04-07 DIAGNOSIS — M5127 Other intervertebral disc displacement, lumbosacral region: Secondary | ICD-10-CM | POA: Diagnosis not present

## 2015-04-07 MED ORDER — IBUPROFEN 200 MG PO TABS: 800.0000 mg | ORAL_TABLET | Freq: Four times a day (QID) | ORAL | Status: AC | PRN

## 2015-04-07 NOTE — Progress Notes (Signed)
RN reviewed discharge instructions with patient and family. All questions answered.   Paperwork and prescriptions given. Patient informed about not using 800 mg IB profen until 04/10/15, and also starting 81 mg aspirin on the same day.   NT rolled patient down in wheelchair to family car.

## 2015-04-07 NOTE — Care Management Note (Addendum)
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 Action/Plan: discahrge  planning  Expected Discharge Date:  04/07/15               Expected Discharge Plan:  Home/Self Care  In-House Referral:     Discharge planning Services  CM Consult  Post Acute Care Choice:    Choice offered to:     DME Arranged:  Rolling walker DME Agency:  [facility]  HH Arranged:    HH Agency:     Status of Service:  Completed, signed off  Medicare Important Message Given:    Date Medicare IM Given:    Medicare IM give by:    Date Additional Medicare IM Given:    Additional Medicare Important Message give by:     If discussed at Long Length of Stay Meetings, dates discussed:    Additional Comments: CM notes [facility].  CM called [facility] DME rep, Lecretia to please deliver the rolling walker to room to pt can discharge.  No other CM needs were communicated.  Yves Dill.Randie Tallarico Christine, RN 04/07/2015, 11:07 AM

## 2015-04-07 NOTE — Progress Notes (Signed)
OT Cancellation Note  Patient Details Name: Austin Bolton MRN: [id number] DOB: 04-09-1961   Cancelled Treatment:    Reason Eval/Treat Not Completed: Other (comment).  Pt is working with PT and looks like he is in pain.  Will give him a break and check back.    Heela Heishman 04/07/2015, 10:49 AM  Marica OtterMaryellen Calan Doren, OTR/L [phone] 04/07/2015

## 2015-04-07 NOTE — Op Note (Signed)
NAMKaris Bolton:  Fortune, Krystofer                  ACCOUNT NO.:  [id number]  MEDICAL RECORD NO.:  [id number]  LOCATION:  1614                         [facility]  PHYSICIAN:  Jene EveryJeffrey Lymon Kidney, M.D.    DATE OF BIRTH:  05-16-1961  DATE OF PROCEDURE:  04/06/2015 DATE OF DISCHARGE:                              OPERATIVE REPORT   PREOPERATIVE DIAGNOSES:  Spinal stenosis, herniated nucleus pulposus at L5-S1, left.  POSTOPERATIVE DIAGNOSES:  Spinal stenosis, herniated nucleus pulposus at L5-S1, left.  PROCEDURES PERFORMED: 1. Microlumbar decompression at L5-S1, left. 2. Foraminotomies at L5 and S1, left. 3. Microdiskectomy at L5 and S1, left.  ANESTHESIA:  General.  ASSISTANT:  Lanna PocheJacqueline Bissell, PA.  SPECIMENS TO PATHOLOGY:  L5-S1 disk.  HISTORY:  A 54 year old with left lower extremity radicular pain, L5 nerve root distribution, S1 nerve root distribution, myotomal weakness, dermatomal dysesthesias, HNP by MRI, refractory to conservative treatment, indicated for decompression at L5-S1 nerve root.  Risk and benefits were discussed including bleeding; infection; damage to neurovascular structures; DVT; PE; anesthetic complications; no changes in symptoms; worsening symptoms; need for fusion in the future, etc.  TECHNIQUE:  With the patient in supine position, after induction of adequate general anesthesia, time-out, prone position, all bony prominences were well padded.  Lumbar region was prepped and draped in usual sterile fashion.  Two 18-gauge spinal needles were utilized to localize the 5-1 interspace, confirmed with x-ray.  Incision was made from the spinous process of 5-1.  Subcutaneous tissue was dissected. Electrocautery was utilized to achieve hemostasis.  A 0.25% Marcaine with epinephrine was infiltrated in the paraspinous musculature. Elevated from the lamina at 5-1, confirmatory radiograph obtained. McCullough retractor was placed.  Operating microscope was draped and brought on  the surgical field.  Hemilaminotomy in the caudad edge of 5 was performed with 2- and 3-mm Kerrison.  Ligamentum flavum was detached from the cephalad edge of S1, utilizing straight curette.  Ligamentum flavum was removed from the interspace.  It was fairly tight compressing the S1 nerve root into the lateral recess.  I performed a generous foraminotomy of S1.  Identified the S1 nerve root, gently mobilized it medially.  Decompressed the lateral recess to the medial border of the pedicle due to the hypertrophic facet.  There was severe stenosis in the lateral recess.  Continuing cephalad, there was stenosis of the 5 foramen and the 5 root exiting out the 5 foramen.  I therefore performed a foraminotomy of 5 particularly the superior articulating process of S1.  We preserved the ample pars.  After mobilizing the thecal sac medially, identified a focal HNP.  Bipolar electrocautery was utilized to achieve hemostasis over an epidural venous plexus.  I then made an incision in the annulus and removed the disk herniation with a micropituitary and further mobilizing it with a nerve hook.  Multiple fragments were then removed from the subligamentous position.  This further decompressed the lateral recess.  Following this, we irrigated the disk space copiously with an antibiotic irrigation and we retrieved an additional fragments.  Confirmatory radiograph was obtained.  We had 1 cm of excursion of the S1 nerve root medial to pedicle without tension and neural  probe passed freely at the foramen of L5 and S1 beneath the thecal sac.  The shoulder and the axilla of the root without disk herniation noted.  Inspection revealed no evidence of CSF leakage or active bleeding.  Then, we had used bone wax on the cancellous surfaces and thrombin-soaked Gelfoam that was then removed.  After copious irrigation, we removed the McCullough retractor.  Paraspinous muscle was inspected, no evidence of active  bleeding.  We repaired the fascia with 1 Vicryl, subcu with 2-0, and skin with subcuticular Prolene.  Sterile dressing was applied.  Placed supine on the hospital bed, extubated without difficulty, and transported to the recovery room in satisfactory condition.  The patient tolerated the procedure well.  No complications.  Assistant, Lanna Poche, PA, was used for the patient closure, intermittent neural traction, and patient positioning.  Minimal blood loss.     Jene Every, M.D.     Cordelia Pen  D:  04/06/2015  T:  04/07/2015  Job:  [id number]

## 2015-04-07 NOTE — Progress Notes (Signed)
Subjective: 1 Day Post-Op Procedure(s) (LRB): MICRO LUMBAR DECOMPRESSION  L5-S1 ON LEFT (Left) Patient reports pain as mild.  Doing well this morning. Denies any leg pain. Back pain well controlled.  Feels ready for D/C home.  Objective: Vital signs in last 24 hours: Temp:  [97.2 F (36.2 C)-98.3 F (36.8 C)] 97.9 F (36.6 C) (06/02 0500) Pulse Rate:  [49-73] 72 (06/02 0500) Resp:  [11-21] 16 (06/02 0500) BP: (100-123)/(60-87) 113/72 mmHg (06/02 0500) SpO2:  [96 %-100 %] 100 % (06/02 0500)  Intake/Output from previous day: 06/01 0701 - 06/02 0700 In: 2179.2 [P.O.:360; I.V.:1714.2; IV Piggyback:105] Out: 1350 [Urine:1350] Intake/Output this shift:    No results for input(s): HGB in the last 72 hours. No results for input(s): WBC, RBC, HCT, PLT in the last 72 hours. No results for input(s): NA, K, CL, CO2, BUN, CREATININE, GLUCOSE, CALCIUM in the last 72 hours. No results for input(s): LABPT, INR in the last 72 hours.  Neurologically intact ABD soft Neurovascular intact Sensation intact distally Intact pulses distally Dorsiflexion/Plantar flexion intact Incision: dressing C/D/I and no drainage No cellulitis present Compartment soft no calf pain or sign of DVT  Assessment/Plan: 1 Day Post-Op Procedure(s) (LRB): MICRO LUMBAR DECOMPRESSION  L5-S1 ON LEFT (Left) Advance diet Up with therapy D/C IV fluids  Seen by Dr. Shelle IronBeane  Discussed D/C instructions, dressing instructions, Lspine precautons Plan D/C home today  Austin Bolton, Austin Bolton. 04/07/2015, 9:59 AM

## 2015-04-07 NOTE — Discharge Summary (Signed)
Physician Discharge Summary   Patient ID: Austin Bolton MRN: [id number] DOB/AGE: [birth year] 54 y.o.  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 History of kidney stones   . Enlarged aorta ASYMPTOMATIC--  NO MEDS  . History of concussion CHILD--  NO RESIDUAL  . Urgency of urination   . Nocturia   . Bicuspid aortic valve     "no problems"  . DDD (degenerative disc disease), lumbar   . HNP (herniated nucleus pulposus)     "at least one"  . Headache     hx of migraines, none recent  . Anemia     hx of  . Depression     no medication-years ago attempted suicide x1  . Elliptocytosis    Discharge Diagnoses:   Active Problems:   HNP (herniated nucleus pulposus), lumbar  Procedure:  Procedure(s) (LRB): MICRO LUMBAR DECOMPRESSION  L5-S1 ON LEFT (Left)   Consults: None  HPI:  see H&P    Laboratory Data: Admission on 05/08/2012, Discharged on 05/09/2012  Component Date Value Ref Range Status  . Color, Urine 05/08/2012 AMBER* YELLOW Final   BIOCHEMICALS MAY BE AFFECTED BY COLOR  . APPearance 05/08/2012 CLOUDY* CLEAR Final  . Specific Gravity, Urine 05/08/2012 1.028  1.005 - 1.030 Final  . pH 05/08/2012 6.0  5.0 - 8.0 Final  . Glucose, UA 05/08/2012 NEGATIVE  NEGATIVE mg/dL Final  . Hgb urine dipstick 05/08/2012 LARGE* NEGATIVE Final  . Bilirubin Urine 05/08/2012 NEGATIVE  NEGATIVE Final  . Ketones, ur 05/08/2012 NEGATIVE  NEGATIVE mg/dL Final  . Protein, ur 78/29/5621 30* NEGATIVE mg/dL Final  . Urobilinogen, UA 05/08/2012 0.2  0.0 - 1.0 mg/dL Final  . Nitrite 30/86/5784 NEGATIVE  NEGATIVE Final  . Leukocytes, UA 05/08/2012 TRACE* NEGATIVE Final  . Sodium 05/08/2012 142  135 - 145 mEq/L Final  . Potassium 05/08/2012 3.7  3.5 - 5.1 mEq/L Final  . Chloride 05/08/2012 106  96 - 112 mEq/L Final  . BUN 05/08/2012 13  6 - 23 mg/dL Final  .  Creatinine, Ser 05/08/2012 0.90  0.50 - 1.35 mg/dL Final  . Glucose, Bld 69/62/9528 111* 70 - 99 mg/dL Final  . Calcium, Ion 41/32/4401 1.20  1.12 - 1.23 mmol/L Final  . TCO2 05/08/2012 24  0 - 100 mmol/L Final  . Hemoglobin 05/08/2012 11.6* 13.0 - 17.0 g/dL Final  . HCT 02/72/5366 34.0* 39.0 - 52.0 % Final  . Squamous Epithelial / LPF 05/08/2012 RARE  RARE Final  . WBC, UA 05/08/2012 0-2  <3 WBC/hpf Final  . RBC / HPF 05/08/2012 TOO NUMEROUS TO COUNT  <3 RBC/hpf Final  . Bacteria, UA 05/08/2012 RARE  RARE Final  . Daryll Drown 05/08/2012 FEW YEAST   Final   No results for input(s): HGB in the last 72 hours. No results for input(s): WBC, RBC, HCT, PLT in the last 72 hours. No results for input(s): NA, K, CL, CO2, BUN, CREATININE, GLUCOSE, CALCIUM in the last 72 hours. No results for input(s): LABPT, INR in the last 72 hours.  X-Rays:Dg Lumbar Spine 2-3 Views  03/29/2015   CLINICAL DATA:  Preop imaging prior to back surgery scheduled April 06, 2015 for herniated disc  EXAM: LUMBAR SPINE - 2-3 VIEW  COMPARISON:  10/13/2014 MRI  FINDINGS: Using the same numbering convention as MRI, there is mild to moderate L3-4 and L4-5 degenerative disc disease. There is normal  anterior-posterior alignment.  Note that there appear to be small ribs bilaterally at the level that would, using this convention, be L1. There may actually be only 4 lumbar type vertebral bodies, confirmed by correlation 08/26/2015 CT scan review.  IMPRESSION: Degenerative disc disease. See above discussion regarding labeling of the lumbar spine levels.   Electronically Signed   By: Esperanza Heir M.D.   On: 03/29/2015 14:53   Dg Spine Portable 1 View  04/06/2015   CLINICAL DATA:  Lumbar spine surgery L5-S1  EXAM: PORTABLE SPINE - 1 VIEW  COMPARISON:  Portable exam #3 at 0943 hours compared to 0845 hours and 0902 hours  FINDINGS: Preceding exam was labeled with 5 lumbar vertebra, current exam labeled similar to prior studies.  Metallic probe  via posterior approach projects over the L5-S1 disc space.  Mild disc space narrowing L4-L5.  Osseous mineralization normal.  Vertebral body heights maintained.  IMPRESSION: Posterior localization of the L5-S1 disc space.   Electronically Signed   By: Ulyses Southward M.D.   On: 04/06/2015 10:12   Dg Spine Portable 1 View  04/06/2015   CLINICAL DATA:  Decompression laminectomy L5-S1  EXAM: PORTABLE SPINE - 1 VIEW  COMPARISON:  Study obtained earlier in the day.  FINDINGS: Lateral lumbar image shows metallic probe tip posterior to the inferior aspect of the L5 vertebral body, overlying the L5 spinous process and posterior laminae. No fracture or spondylolisthesis.  IMPRESSION: Metallic probe tip is posterior to the inferior aspect of the L5 vertebral body. No fracture or spondylolisthesis.   Electronically Signed   By: Bretta Bang III M.D.   On: 04/06/2015 09:13   Dg Spine Portable 1 View  04/06/2015   CLINICAL DATA:  L5-S1 lumbar decompression  EXAM: PORTABLE SPINE - 1 VIEW  COMPARISON:  Mar 29, 2015  FINDINGS: Cross-table lateral lumbar image obtained. There is a metallic probe overlying the posterior aspect of the L5 spinous process with the tip directed toward the L5-S1 interspace. A second probe tip is posterior to the superior aspect of the S1 vertebral body. This probe is slightly superior to the S1 spinous process. There is mild disc space narrowing at L4-5.  IMPRESSION: Metallic probes as described.  Mild narrowing L4-5.   Electronically Signed   By: Bretta Bang III M.D.   On: 04/06/2015 08:57    EKG: Orders placed or performed during the hospital encounter of 05/13/12  . EKG 12-Lead  . EKG 12-Lead  . EKG 12-Lead  . EKG 12-Lead  . EKG     Hospital Course: Patient was admitted to [facility] and taken to the OR and underwent the above state procedure without complications.  Patient tolerated the procedure well and was later transferred to the recovery room and then to the  orthopaedic floor for postoperative care.  They were given PO and IV analgesics for pain control following their surgery.  They were given 24 hours of postoperative antibiotics.   PT was consulted postop to assist with mobility and transfers.  The patient was allowed to be WBAT with therapy and was taught back precautions. Discharge planning was consulted to help with postop disposition and equipment needs.  Patient had a good night on the evening of surgery and started to get up OOB with therapy on day one. Patient was seen in rounds and was ready to go home on day one.  They were given discharge instructions and dressing directions.  They were instructed on when to follow up  in the office with Dr. Shelle IronBeane.   Diet: Regular diet Activity:WBAT; Lspine precautions Follow-up:in 10-14 days Disposition - Home Discharged Condition: good   Discharge Instructions    Call MD / Call 911    Complete by:  As directed   If you experience chest pain or shortness of breath, CALL 911 and be transported to the hospital emergency room.  If you develope a fever above 101 F, pus (white drainage) or increased drainage or redness at the wound, or calf pain, call your surgeon's office.     Constipation Prevention    Complete by:  As directed   Drink plenty of fluids.  Prune juice may be helpful.  You may use a stool softener, such as Colace (over the counter) 100 mg twice a day.  Use MiraLax (over the counter) for constipation as needed.     Diet - low sodium heart healthy    Complete by:  As directed      Increase activity slowly as tolerated    Complete by:  As directed             Medication List    STOP taking these medications        hyoscyamine 0.125 MG SL tablet  Commonly known as:  LEVSIN SL      TAKE these medications        docusate sodium 100 MG capsule  Commonly known as:  COLACE  Take 1 capsule (100 mg total) by mouth 2 (two) times daily as needed for mild constipation.     ibuprofen 200  MG tablet  Commonly known as:  ADVIL,MOTRIN  Take 4 tablets (800 mg total) by mouth every 6 (six) hours as needed for headache or moderate pain. May resume 4 days post-op     methocarbamol 500 MG tablet  Commonly known as:  ROBAXIN  Take 1 tablet (500 mg total) by mouth every 8 (eight) hours as needed for muscle spasms.     multivitamin tablet  Take 1 tablet by mouth every morning.     oxyCODONE-acetaminophen 5-325 MG per tablet  Commonly known as:  PERCOCET  Take 1 tablet by mouth every 4 (four) hours as needed.     tamsulosin 0.4 MG Caps capsule  Commonly known as:  FLOMAX  Take 1 capsule (0.4 mg total) by mouth daily after breakfast.           Follow-up Information    Follow up with BEANE,JEFFREY C, MD In 2 weeks.   Specialty:  Orthopedic Surgery   Why:  For suture removal   Contact information:   [address] [place] [address] [phone]       Signed: Andrez GrimeJaclyn Bissell, PA-C Orthopaedic Surgery 04/07/2015, 10:07 AM

## 2015-04-07 NOTE — Evaluation (Signed)
Physical Therapy Evaluation Patient Details Name: Austin JubaRandy Bolton MRN: [id number] DOB: 1960/11/24 Today's Date: 04/07/2015   History of Present Illness  L5-S1 microlumbar decompression  Clinical Impression  Pt s/p back surgery and presents with functional mobility limitations 2* post op pain and back precautions.  Pt mobilizing at Sup/min guard level and with good awareness of restrictions.  Pt plans dc home with family assist.    Follow Up Recommendations No PT follow up    Equipment Recommendations  Rolling walker with 5" wheels    Recommendations for Other Services OT consult     Precautions / Restrictions Precautions Precautions: Fall;Back Precaution Booklet Issued: Yes (comment) Precaution Comments: All precautions reviewed x 3 Restrictions Weight Bearing Restrictions: No      Mobility  Bed Mobility Overal bed mobility: Needs Assistance Bed Mobility: Supine to Sit;Sit to Supine     Supine to sit: Min guard;Supervision Sit to supine: Min guard;Supervision   General bed mobility comments: cues for correct log roll technique and adherence to back precautions  Transfers Overall transfer level: Needs assistance Equipment used: Rolling walker (2 wheeled) Transfers: Sit to/from Stand Sit to Stand: Min guard;Supervision         General transfer comment: cues for transition position, use of UEs to self assist and adherence to back precautions  Ambulation/Gait Ambulation/Gait assistance: Min guard;Supervision Ambulation Distance (Feet): 140 Feet Assistive device: Rolling walker (2 wheeled) Gait Pattern/deviations: Step-to pattern;Step-through pattern;Decreased step length - right;Decreased step length - left;Shuffle;Trunk flexed Gait velocity: decr   General Gait Details: min cues for posture and position from RW  Stairs Stairs: Yes Stairs assistance: Min guard Stair Management: One rail Left;Two rails;Step to pattern;Forwards Number of Stairs: 5 General stair  comments: 2 step with bil rails and 3 step with single rail  Wheelchair Mobility    Modified Rankin (Stroke Patients Only)       Balance                                             Pertinent Vitals/Pain Pain Assessment: 0-10 Pain Score: 5  Pain Location: low back Pain Descriptors / Indicators: Aching;Sore Pain Intervention(s): Limited activity within patient's tolerance;Monitored during session;Premedicated before session    Home Living Family/patient expects to be discharged to:: Private residence Living Arrangements: Spouse/significant other Available Help at Discharge: Family Type of Home: House Home Access: Stairs to enter Entrance Stairs-Rails: Can reach both Entrance Stairs-Number of Steps: 2 Home Layout: Two level;Other (Comment) Home Equipment: None      Prior Function Level of Independence: Independent               Hand Dominance        Extremity/Trunk Assessment   Upper Extremity Assessment: Overall WFL for tasks assessed           Lower Extremity Assessment: Overall WFL for tasks assessed      Cervical / Trunk Assessment: Normal  Communication   Communication: No difficulties  Cognition Arousal/Alertness: Awake/alert Behavior During Therapy: WFL for tasks assessed/performed Overall Cognitive Status: Within Functional Limits for tasks assessed                      General Comments      Exercises        Assessment/Plan    PT Assessment Patient needs continued PT services  PT Diagnosis Difficulty walking  PT Problem List Decreased activity tolerance;Decreased range of motion;Decreased strength;Decreased mobility;Decreased knowledge of use of DME;Pain;Decreased knowledge of precautions  PT Treatment Interventions DME instruction;Gait training;Functional mobility training;Stair training;Therapeutic activities;Patient/family education   PT Goals (Current goals can be found in the Care Plan section)  Acute Rehab PT Goals Patient Stated Goal: Less pain PT Goal Formulation: With patient Time For Goal Achievement: 04/09/15 Potential to Achieve Goals: Good    Frequency 7X/week   Barriers to discharge        Co-evaluation               End of Session   Activity Tolerance: Patient tolerated treatment well;Patient limited by pain Patient left: in bed;with call bell/phone within reach Nurse Communication: Mobility status    Functional Assessment Tool Used: Clinical judgement Functional Limitation: Mobility: Walking and moving around Mobility: Walking and Moving Around Current Status (Z6109): At least 1 percent but less than 20 percent impaired, limited or restricted Mobility: Walking and Moving Around Goal Status [phone]): At least 1 percent but less than 20 percent impaired, limited or restricted Mobility: Walking and Moving Around Discharge Status [phone]): At least 1 percent but less than 20 percent impaired, limited or restricted    Time: 1032-1102 PT Time Calculation (min) (ACUTE ONLY): 30 min   Charges:   PT Evaluation $Initial PT Evaluation Tier I: 1 Procedure PT Treatments $Gait Training: 8-22 mins   PT G Codes:   PT G-Codes **NOT FOR INPATIENT CLASS** Functional Assessment Tool Used: Clinical judgement Functional Limitation: Mobility: Walking and moving around Mobility: Walking and Moving Around Current Status (B1478): At least 1 percent but less than 20 percent impaired, limited or restricted Mobility: Walking and Moving Around Goal Status [phone]): At least 1 percent but less than 20 percent impaired, limited or restricted Mobility: Walking and Moving Around Discharge Status [phone]): At least 1 percent but less than 20 percent impaired, limited or restricted    Madylin Fairbank 04/07/2015, 12:49 PM

## 2015-04-07 NOTE — Evaluation (Addendum)
Occupational Therapy Evaluation Patient Details Name: Austin Bolton MRN: [id number] DOB: Sep 20, 1961 Today's Date: 04/07/2015    History of Present Illness L5-S1 microlumbar decompression   Clinical Impression   This 54 year old man was admitted for the above surgery.  All education was completed.  No further OT is needed at this time.      Follow Up Recommendations  No OT follow up    Equipment Recommendations  None recommended by OT (pt wants to try his commode at home without DME)    Recommendations for Other Services       Precautions / Restrictions Precautions Precautions: Fall;Back Precaution Booklet Issued: Yes (comment) Precaution Comments: reviewed adls/precautions Restrictions Weight Bearing Restrictions: No      Mobility Bed Mobility Overal bed mobility: Needs Assistance Bed Mobility: Supine to Sit;Sit to Supine     Supine to sit: Supervision Sit to supine: Supervision   General bed mobility comments: cues for technique  Transfers Overall transfer level: Needs assistance Equipment used: Rolling walker (2 wheeled) Transfers: Sit to/from Stand Sit to Stand: Supervision         General transfer comment: cues for UE placement    Balance                                            ADL Overall ADL's : Needs assistance/impaired                         Toilet Transfer: Min guard;Ambulation;Comfort height toilet             General ADL Comments: reviewed ADLs and back precautions.  Pt did not want to get washed or dressed this session.  He normally bends forward for LB adls:  reviewed safe alternative methods.  Pt plans to have wife assist him.  Educated on toilet aide:  he has had pain with hygiene prior to sx.  He feels that his toilet is similar height to ours (17 1/2") and will try his at home.  If needed, he will look at DME to go over it.  Pt is able to perform UB adls with set up and LB bathing with mod A, dressing  with max A.  Educated on safely stepping over tub when steady     Vision     Perception     Praxis      Pertinent Vitals/Pain Pain Assessment: 0-10 Pain Score: 3  Pain Location: low back Pain Descriptors / Indicators: Aching Pain Intervention(s): Limited activity within patient's tolerance;Monitored during session;Repositioned;Premedicated before session     Hand Dominance     Extremity/Trunk Assessment Upper Extremity Assessment Upper Extremity Assessment: Overall WFL for tasks assessed      Cervical / Trunk Assessment Cervical / Trunk Assessment: Normal   Communication Communication Communication: No difficulties   Cognition Arousal/Alertness: Awake/alert Behavior During Therapy: WFL for tasks assessed/performed Overall Cognitive Status: Within Functional Limits for tasks assessed                     General Comments       Exercises       Shoulder Instructions      Home Living Family/patient expects to be discharged to:: Private residence Living Arrangements: Spouse/significant other Available Help at Discharge: Family Type of Home: House Home Access: Stairs to enter [employer] of Steps:  2 Entrance Stairs-Rails: Can reach both Home Layout: Two level;Other (Comment) Alternate Level Stairs-Number of Steps: 14 Alternate Level Stairs-Rails: Left Bathroom Shower/Tub: Tub/shower unit;Curtain Shower/tub characteristics: Engineer, building servicesCurtain Bathroom Toilet: Standard     Home Equipment: None          Prior Functioning/Environment Level of Independence: Independent             OT Diagnosis: Acute pain   OT Problem List:     OT Treatment/Interventions:      OT Goals(Current goals can be found in the care plan section) Acute Rehab OT Goals Patient Stated Goal: Less pain  OT Frequency:     Barriers to D/C:            Co-evaluation              End of Session    Activity Tolerance: Patient tolerated treatment well Patient  left: in bed;with call bell/phone within reach   Time: 1136-1201 OT Time Calculation (min): 25 min Charges:  OT General Charges $OT Visit: 1 Procedure OT Evaluation $Initial OT Evaluation Tier I: 1 Procedure G-Codes: OT G-codes **NOT FOR INPATIENT CLASS** Functional Assessment Tool Used: clinical judgment Functional Limitation: Self care Self Care Current Status (N8295(G8987): At least 60 percent but less than 80 percent impaired, limited or restricted Self Care Goal Status (A2130(G8988): At least 60 percent but less than 80 percent impaired, limited or restricted Self Care Discharge Status [phone]): At least 60 percent but less than 80 percent impaired, limited or restricted  Debroah Shuttleworth 04/07/2015, 1:01 PM  Marica OtterMaryellen Magaret Justo, OTR/L [phone] 04/07/2015

## 2015-04-07 NOTE — Care Management Note (Signed)
Case Management Note  Patient Details  Name: Austin Bolton MRN: [id number] Date of Birth: 08-17-1961  Subjective/Objective:                    Action/Plan:   Expected Discharge Date:  04/07/15               Expected Discharge Plan:  Home/Self Care  In-House Referral:     Discharge planning Services  CM Consult  Post Acute Care Choice:    Choice offered to:     DME Arranged:  Walker rolling DME Agency:  [facility]  [facility]     Status of Service:  Completed, signed off  Medicare Important Message Given:    Date Medicare IM Given:    Medicare IM give by:    Date Additional Medicare IM Given:    Additional Medicare Important Message give by:     If discussed at Long Length of Stay Meetings, dates discussed:    Additional Comments:  Yves DillJeffries, Thalya Fouche Christine, RN 04/07/2015, 11:13 AM

## 2015-09-16 ENCOUNTER — Other Ambulatory Visit: Payer: Self-pay | Admitting: Anesthesiology

## 2015-09-16 DIAGNOSIS — M961 Postlaminectomy syndrome, not elsewhere classified: Secondary | ICD-10-CM

## 2015-10-04 ENCOUNTER — Ambulatory Visit
Admission: RE | Admit: 2015-10-04 | Discharge: 2015-10-04 | Disposition: A | Discharge status: Home / Self Care | Payer: BLUE CROSS/BLUE SHIELD | Source: Ambulatory Visit | Attending: Anesthesiology | Admitting: Anesthesiology

## 2015-10-04 DIAGNOSIS — M961 Postlaminectomy syndrome, not elsewhere classified: Secondary | ICD-10-CM

## 2015-10-10 ENCOUNTER — Other Ambulatory Visit (HOSPITAL_COMMUNITY): Payer: Self-pay | Admitting: Neurological Surgery

## 2015-10-18 ENCOUNTER — Telehealth: Payer: Self-pay | Admitting: Vascular Surgery

## 2015-10-18 NOTE — Telephone Encounter (Signed)
-----   Message from Phillips Odorarol S Pullins, RN sent at 10/18/2015  1:00 PM EST ----- Regarding: needs office consult with Dr. Edilia Boickson Please schedule for office consult with Dr. Edilia Boickson prior to ALIF on 11/17/15.  Please remind pt. to bring copy of LS spine films to appt. with him.  Thank you.

## 2015-10-18 NOTE — Telephone Encounter (Signed)
Spoke with pt to schedule, his films are viewable on EPIC, dpm

## 2015-10-24 ENCOUNTER — Other Ambulatory Visit: Payer: Self-pay

## 2015-11-01 ENCOUNTER — Encounter: Payer: Self-pay | Admitting: Vascular Surgery

## 2015-11-08 ENCOUNTER — Encounter (HOSPITAL_COMMUNITY): Payer: Self-pay

## 2015-11-08 ENCOUNTER — Ambulatory Visit (HOSPITAL_COMMUNITY)
Admission: RE | Admit: 2015-11-08 | Discharge: 2015-11-08 | Disposition: A | Discharge status: Home / Self Care | Payer: BLUE CROSS/BLUE SHIELD | Source: Ambulatory Visit | Attending: Neurological Surgery | Admitting: Neurological Surgery

## 2015-11-08 ENCOUNTER — Encounter (HOSPITAL_COMMUNITY)
Admission: RE | Admit: 2015-11-08 | Discharge: 2015-11-08 | Disposition: A | Discharge status: Home / Self Care | Payer: BLUE CROSS/BLUE SHIELD | Source: Ambulatory Visit | Attending: Neurological Surgery | Admitting: Neurological Surgery

## 2015-11-08 DIAGNOSIS — Z01812 Encounter for preprocedural laboratory examination: Secondary | ICD-10-CM | POA: Insufficient documentation

## 2015-11-08 DIAGNOSIS — I517 Cardiomegaly: Secondary | ICD-10-CM | POA: Insufficient documentation

## 2015-11-08 DIAGNOSIS — Z01810 Encounter for preprocedural cardiovascular examination: Secondary | ICD-10-CM | POA: Diagnosis not present

## 2015-11-08 DIAGNOSIS — M961 Postlaminectomy syndrome, not elsewhere classified: Secondary | ICD-10-CM | POA: Insufficient documentation

## 2015-11-08 DIAGNOSIS — R918 Other nonspecific abnormal finding of lung field: Secondary | ICD-10-CM | POA: Diagnosis not present

## 2015-11-08 DIAGNOSIS — Z01818 Encounter for other preprocedural examination: Secondary | ICD-10-CM | POA: Diagnosis not present

## 2015-11-08 HISTORY — DX: Other specified postprocedural states: Z98.890

## 2015-11-08 HISTORY — DX: Nausea with vomiting, unspecified: R11.2

## 2015-11-08 HISTORY — DX: Disease of blood and blood-forming organs, unspecified: D75.9

## 2015-11-08 LAB — COMPREHENSIVE METABOLIC PANEL
ALT: 14 U/L — AB (ref 17–63)
AST: 16 U/L (ref 15–41)
Albumin: 4.2 g/dL (ref 3.5–5.0)
Alkaline Phosphatase: 45 U/L (ref 38–126)
Anion gap: 6 (ref 5–15)
BILIRUBIN TOTAL: 1.3 mg/dL — AB (ref 0.3–1.2)
BUN: 8 mg/dL (ref 6–20)
CALCIUM: 9.5 mg/dL (ref 8.9–10.3)
CO2: 28 mmol/L (ref 22–32)
CREATININE: 1.05 mg/dL (ref 0.61–1.24)
Chloride: 106 mmol/L (ref 101–111)
GFR calc Af Amer: >60 mL/min (ref >60)
Glucose, Bld: 109 mg/dL — ABNORMAL HIGH (ref 65–99)
Potassium: 4.8 mmol/L (ref 3.5–5.1)
Sodium: 140 mmol/L (ref 135–145)
TOTAL PROTEIN: 6.6 g/dL (ref 6.5–8.1)

## 2015-11-08 LAB — PROTIME-INR
INR: 1.08 (ref 0.00–1.49)
PROTHROMBIN TIME: 14.2 s (ref 11.6–15.2)

## 2015-11-08 LAB — CBC WITH DIFFERENTIAL/PLATELET
BASOS ABS: 0 10*3/uL (ref 0.0–0.1)
BASOS PCT: 1 %
EOS ABS: 0.2 10*3/uL (ref 0.0–0.7)
Eosinophils Relative: 4 %
HCT: 40.6 % (ref 39.0–52.0)
Hemoglobin: 14.1 g/dL (ref 13.0–17.0)
Lymphocytes Relative: 13 %
Lymphs Abs: 0.5 10*3/uL — ABNORMAL LOW (ref 0.7–4.0)
MCH: 30.7 pg (ref 26.0–34.0)
MCHC: 34.7 g/dL (ref 30.0–36.0)
MCV: 88.5 fL (ref 78.0–100.0)
MONO ABS: 0.4 10*3/uL (ref 0.1–1.0)
MONOS PCT: 10 %
NEUTROS PCT: 72 %
Neutro Abs: 2.9 10*3/uL (ref 1.7–7.7)
Platelets: 180 10*3/uL (ref 150–400)
RBC: 4.59 MIL/uL (ref 4.22–5.81)
RDW: 14.1 % (ref 11.5–15.5)
WBC: 4 10*3/uL (ref 4.0–10.5)

## 2015-11-08 LAB — ABO/RH: ABO/RH(D): A POS

## 2015-11-08 LAB — TYPE AND SCREEN
ABO/RH(D): A POS
Antibody Screen: NEGATIVE

## 2015-11-08 LAB — SURGICAL PCR SCREEN
MRSA, PCR: NEGATIVE
STAPHYLOCOCCUS AUREUS: POSITIVE — AB

## 2015-11-08 NOTE — Progress Notes (Signed)
REQUESTED STRESS TEST, OV, EKG, ? ECHO IF DONE FROM DR. FULK AT [facility] IN [place].

## 2015-11-08 NOTE — Pre-Procedure Instructions (Signed)
Austin Bolton  11/08/2015      [id number] - [name] - [address] [name] [id number] Phone: [phone] Fax: [phone]    Your procedure is scheduled on  Thursday  11/17/15  [facility] at 530 A.M.  Call this number if you have problems the morning of surgery:  [phone]   Remember:  Do not eat food or drink liquids after midnight.  Take these medicines the morning of surgery with A SIP OF WATER   OXYCODONE IF NEEDED (STOP IBUPROFEN/ ADVIL/ MOTRIN)   Do not wear jewelry, make-up or nail polish.  Do not wear lotions, powders, or perfumes.  You may wear deodorant.  Do not shave 48 hours prior to surgery.  Men may shave face and neck.  Do not bring valuables to the hospital.  [facility] is not responsible for any belongings or valuables.  Contacts, dentures or bridgework may not be worn into surgery.  Leave your suitcase in the car.  After surgery it may be brought to your room.  For patients admitted to the hospital, discharge time will be determined by your treatment team.  Patients discharged the day of surgery will not be allowed to drive home.   Name and phone number of your driver:   Special instructions: Austin Bolton - Preparing for Surgery  Before surgery, you can play an important role.  Because skin is not sterile, your skin needs to be as free of germs as possible.  You can reduce the number of germs on you skin by washing with CHG (chlorahexidine gluconate) soap before surgery.  CHG is an antiseptic cleaner which kills germs and bonds with the skin to continue killing germs even after washing.  Please DO NOT use if you have an allergy to CHG or antibacterial soaps.  If your skin becomes reddened/irritated stop using the CHG and inform your nurse when you arrive at Short Stay.  Do not shave (including legs and underarms) for at least 48 hours prior to the first CHG shower.  You may shave your  face.  Please follow these instructions carefully:   1.  Shower with CHG Soap the night before surgery and the                                morning of Surgery.  2.  If you choose to wash your hair, wash your hair first as usual with your       normal shampoo.  3.  After you shampoo, rinse your hair and body thoroughly to remove the                      Shampoo.  4.  Use CHG as you would any other liquid soap.  You can apply chg directly       to the skin and wash gently with scrungie or a clean washcloth.  5.  Apply the CHG Soap to your body ONLY FROM THE NECK DOWN.        Do not use on open wounds or open sores.  Avoid contact with your eyes,       ears, mouth and genitals (private parts).  Wash genitals (private parts)       with your normal soap.  6.  Wash thoroughly, paying special attention to the area where your surgery  will be performed.  7.  Thoroughly rinse your body with warm water from the neck down.  8.  DO NOT shower/wash with your normal soap after using and rinsing off       the CHG Soap.  9.  Pat yourself dry with a clean towel.            10.  Wear clean pajamas.            11.  Place clean sheets on your bed the night of your first shower and do not        sleep with pets.  Day of Surgery  Do not apply any lotions/deoderants the morning of surgery.  Please wear clean clothes to the hospital/surgery center.    Please read over the following fact sheets that you were given. Pain Booklet, Coughing and Deep Breathing, Blood Transfusion Information, MRSA Information and Surgical Site Infection Prevention

## 2015-11-09 ENCOUNTER — Other Ambulatory Visit: Payer: Self-pay | Admitting: Neurological Surgery

## 2015-11-09 ENCOUNTER — Ambulatory Visit (INDEPENDENT_AMBULATORY_CARE_PROVIDER_SITE_OTHER): Payer: BLUE CROSS/BLUE SHIELD | Admitting: Vascular Surgery

## 2015-11-09 ENCOUNTER — Encounter: Payer: Self-pay | Admitting: Vascular Surgery

## 2015-11-09 VITALS — BP 114/85 | HR 87 | Temp 97.9°F | Resp 16 | Ht 74.0 in | Wt 198.0 lb

## 2015-11-09 DIAGNOSIS — M5137 Other intervertebral disc degeneration, lumbosacral region: Secondary | ICD-10-CM | POA: Diagnosis not present

## 2015-11-09 NOTE — Progress Notes (Signed)
Anesthesia Chart Review:  Pt is 55 year old male scheduled for L5-S1 ALIF, abdominal exposure on 11/17/2015 with Dr. Yetta BarreJones and Dr. Edilia Boickson.   Cardiologist is Dr. Vernona Riegerhomas Folk [facility], last office visit 08/29/15 (care everywhere).   PMH includes:  Bicuspid arotic valve, enlarged aorta, ascending aorta dilitation, anemia, elliptocytosis, post-op N/V. Never smoker. BMI 25. S/p L5-S1 micro decompression 04/06/15. S/p cystoscopy 05/13/12 and 05/08/12.   Preoperative labs reviewed.    Chest x-ray 11/08/15 reviewed. Rounded density seen over left lower lobe most consistent with overlying nipple shadow; repeat radiograph nipple markers is recommended to rule out pulmonary nodule. No other abnormality seen in the chest. Left voicemail for [name] in Dr. Yetta BarreJones' office about need to repeat CXR with nipple markers.   EKG 11/08/15: NSR. Possible LAE.   Nuclear stress test 11/01/15:  - No inducible ischemia noted on this study. Normal EF 68%  Echo 08/22/15:  - LV cavity normal in size. Normal global wall motion. Ef 65% - Aortic valve appears bicuspid with mild sclerosis. Mild regurgitation and small gradient across the valve - Trace MR - Mild TR - Mild pulmonary HTN - Aortic root and ascending aorta are mildly dilated (4.9 cm).   If no changes, I anticipate pt can proceed with surgery as scheduled.   Rica Mastngela Zelma Snead, FNP-BC [facility] Phone: [phone] 11/09/2015 3:09 PM

## 2015-11-09 NOTE — Progress Notes (Signed)
[facility]  Patient name: Austin Bolton MRN: [id number] DOB: 24-Jun-1961 Sex: male  REASON FOR CONSULT: Evaluate for anterior retroperitoneal exposure of L5-S1. Referred by Dr. Marikay Alaravid Jones.  HPI: Austin JubaRandy Bolton is a 55 y.o. male, who has a long history of low back pain. This has been worse over the last year and a half. He has tried physical therapy, injection therapy, and pain medicine without significant relief. He has undergone previous back surgery via a posterior approach. On 04/06/2015, he had microlumbar decompression at L5-S1, foraminotomies at L5-S1, and  microdiscectomy at L5-S1. This was by Dr.  Paula LibraJeff Beane. He has been evaluated for anterior lumbar interbody fusion by Dr. Marikay Alaravid Jones and I have been asked to provide anterior retroperitoneal exposure.  He has had a previous cholecystectomy.  He has no real risk factors for peripheral vascular disease. He denies diabetes, hypertension, hypercholesterolemia, any family history of premature cardiovascular disease, or tobacco use. He denies any history of claudication or rest pain.  Past Medical History  Diagnosis Date  . History of kidney stones   . Enlarged aorta (HCC) ASYMPTOMATIC--  NO MEDS  . History of concussion CHILD--  NO RESIDUAL  . Urgency of urination   . Nocturia   . Bicuspid aortic valve     "no problems"  . DDD (degenerative disc disease), lumbar   . HNP (herniated nucleus pulposus)     "at least one"  . Headache     hx of migraines, none recent  . Anemia     hx of  . Depression     no medication-years ago attempted suicide x1  . Elliptocytosis (HCC)   . PONV (postoperative nausea and vomiting)   . Blood dyscrasia     Family History  Problem Relation Age of Onset  . Colon cancer Mother     SOCIAL HISTORY: Social History   Social History  . Marital Status: Married    Spouse Name: N/A  . Number of Children: N/A  . Years of Education: N/A   Occupational History  . Not on  file.   Social History Main Topics  . Smoking status: Never Smoker   . Smokeless tobacco: Never Used  . Alcohol Use: No  . Drug Use: Yes    Special: Marijuana     Comment: OCCASIONAL USE OF MARIJUANA--   LAST USED FEW WEEKS AGO   . Sexual Activity: Not on file   Other Topics Concern  . Not on file   Social History Narrative    No Known Allergies  Current Outpatient Prescriptions  Medication Sig Dispense Refill  . ibuprofen (ADVIL,MOTRIN) 200 MG tablet Take 4 tablets (800 mg total) by mouth every 6 (six) hours as needed for headache or moderate pain. May resume 4 days post-op (Patient taking differently: Take 800 mg by mouth every 6 (six) hours as needed for headache or moderate pain. ) 30 tablet 0  . mupirocin ointment (BACTROBAN) 2 % 2 (two) times daily. Nose    . oxyCODONE-acetaminophen (PERCOCET) 5-325 MG per tablet Take 1 tablet by mouth every 4 (four) hours as needed. (Patient taking differently: Take 1 tablet by mouth every 4 (four) hours as needed for moderate pain. ) 40 tablet 0   No current facility-administered medications for this visit.    REVIEW OF SYSTEMS:  [X]  denotes positive finding, [ ]  denotes negative finding Cardiac  Comments:  Chest pain or chest pressure:    Shortness of breath upon exertion:  Short of breath when lying flat:    Irregular heart rhythm:        Vascular    Pain in calf, thigh, or hip brought on by ambulation:    Pain in feet at night that wakes you up from your sleep:     Blood clot in your veins:    Leg swelling:         Pulmonary    Oxygen at home:    Productive cough:     Wheezing:         Neurologic    Sudden weakness in arms or legs:     Sudden numbness in arms or legs:     Sudden onset of difficulty speaking or slurred speech:    Temporary loss of vision in one eye:     Problems with dizziness:         Gastrointestinal    Blood in stool:     Vomited blood:         Genitourinary    Burning when urinating:       Blood in urine:        Psychiatric    Major depression:         Hematologic    Bleeding problems:    Problems with blood clotting too easily:        Skin    Rashes or ulcers:        Constitutional    Fever or chills:      PHYSICAL EXAM: Filed Vitals:   11/09/15 1517  BP: 114/85  Pulse: 87  Temp: 97.9 F (36.6 C)  TempSrc: Oral  Resp: 16  Height: 6\' 2"  (1.88 m)  Weight: 198 lb (89.812 kg)  SpO2: 100%    GENERAL: The patient is a well-nourished male, in no acute distress. The vital signs are documented above. CARDIAC: There is a regular rate and rhythm.  VASCULAR: I do not detect carotid bruits. He has palpable femoral pulses and palpable pedal pulses bilaterally. PULMONARY: There is good air exchange bilaterally without wheezing or rales. ABDOMEN: Soft and non-tender with normal pitched bowel sounds.  MUSCULOSKELETAL: There are no major deformities or cyanosis. NEUROLOGIC: No focal weakness or paresthesias are detected. SKIN: There are no ulcers or rashes noted. PSYCHIATRIC: The patient has a normal affect.  DATA:  I have reviewed his CT scan. There is not much angulation to the L5-S1 disc space. The left common iliac vein is at the superior aspect of the disc.  MEDICAL ISSUES:  DEGENERATIVE DISC DISEASE L5-S1: The patient appears to be a reasonable candidate for anterior retroperitoneal exposure of L5-S1. This is scheduled for 11/17/2015. I have reviewed our role in exposure of the spine in order to allow anterior lumbar interbody fusion at the appropriate levels. We have discussed the potential complications of surgery, including but not limited to, arterial or venous injury, thrombosis, or bleeding. We have also discussed the potential risks of wound healing problems, the development of a hernia, nerve injury, leg swelling, or other unpredictable medical problems. I've also explained that for the L5-S1 level there is a small risk of retrograde ejaculation. All the  patient's questions were answered and they are agreeable to proceed.  Waverly Ferrari [facility] [place]: [phone]

## 2015-11-17 ENCOUNTER — Inpatient Hospital Stay (HOSPITAL_COMMUNITY): Payer: BLUE CROSS/BLUE SHIELD

## 2015-11-17 ENCOUNTER — Encounter (HOSPITAL_COMMUNITY)
Admission: RE | Disposition: A | Discharge status: Home Health | Payer: Self-pay | Source: Ambulatory Visit | Attending: Neurological Surgery

## 2015-11-17 ENCOUNTER — Inpatient Hospital Stay (HOSPITAL_COMMUNITY): Payer: BLUE CROSS/BLUE SHIELD | Admitting: Emergency Medicine

## 2015-11-17 ENCOUNTER — Inpatient Hospital Stay (HOSPITAL_COMMUNITY): Payer: BLUE CROSS/BLUE SHIELD | Admitting: Anesthesiology

## 2015-11-17 ENCOUNTER — Inpatient Hospital Stay (HOSPITAL_COMMUNITY)
Admission: RE | Admit: 2015-11-17 | Discharge: 2015-11-22 | DRG: 460 | Disposition: A | Discharge status: Home Health | Payer: BLUE CROSS/BLUE SHIELD | Source: Ambulatory Visit | Attending: Neurological Surgery | Admitting: Neurological Surgery

## 2015-11-17 ENCOUNTER — Encounter (HOSPITAL_COMMUNITY): Payer: Self-pay | Admitting: Neurological Surgery

## 2015-11-17 DIAGNOSIS — Z79899 Other long term (current) drug therapy: Secondary | ICD-10-CM | POA: Diagnosis not present

## 2015-11-17 DIAGNOSIS — Z981 Arthrodesis status: Secondary | ICD-10-CM | POA: Diagnosis not present

## 2015-11-17 DIAGNOSIS — D581 Hereditary elliptocytosis: Secondary | ICD-10-CM | POA: Diagnosis present

## 2015-11-17 DIAGNOSIS — Z01811 Encounter for preprocedural respiratory examination: Secondary | ICD-10-CM

## 2015-11-17 DIAGNOSIS — M5137 Other intervertebral disc degeneration, lumbosacral region: Principal | ICD-10-CM | POA: Diagnosis present

## 2015-11-17 DIAGNOSIS — Z9049 Acquired absence of other specified parts of digestive tract: Secondary | ICD-10-CM

## 2015-11-17 DIAGNOSIS — M961 Postlaminectomy syndrome, not elsewhere classified: Secondary | ICD-10-CM

## 2015-11-17 DIAGNOSIS — Q231 Congenital insufficiency of aortic valve: Secondary | ICD-10-CM

## 2015-11-17 DIAGNOSIS — M5136 Other intervertebral disc degeneration, lumbar region: Secondary | ICD-10-CM

## 2015-11-17 DIAGNOSIS — Z419 Encounter for procedure for purposes other than remedying health state, unspecified: Secondary | ICD-10-CM

## 2015-11-17 HISTORY — PX: ABDOMINAL EXPOSURE: SHX5708

## 2015-11-17 HISTORY — PX: ANTERIOR LUMBAR FUSION: SHX1170

## 2015-11-17 SURGERY — ANTERIOR LUMBAR FUSION 1 LEVEL
Anesthesia: General

## 2015-11-17 MED ORDER — FENTANYL CITRATE (PF) 100 MCG/2ML IJ SOLN
25.0000 ug | INTRAMUSCULAR | Status: DC | PRN
  Administered 2015-11-17 (×2): 50 ug via INTRAVENOUS

## 2015-11-17 MED ORDER — OXYCODONE-ACETAMINOPHEN 5-325 MG PO TABS
1.0000 | ORAL_TABLET | ORAL | Status: DC | PRN
  Administered 2015-11-17 – 2015-11-21 (×16): 2 via ORAL
  Filled 2015-11-17 (×17): qty 2

## 2015-11-17 MED ORDER — VECURONIUM BROMIDE 10 MG IV SOLR
INTRAVENOUS | Status: DC | PRN
  Administered 2015-11-17 (×3): 2 mg via INTRAVENOUS

## 2015-11-17 MED ORDER — ACETAMINOPHEN 650 MG RE SUPP: 650.0000 mg | RECTAL | Status: DC | PRN

## 2015-11-17 MED ORDER — SODIUM CHLORIDE 0.9 % IJ SOLN: 3.0000 mL | INTRAMUSCULAR | Status: DC | PRN

## 2015-11-17 MED ORDER — SODIUM CHLORIDE 0.9 % IJ SOLN
INTRAMUSCULAR | Status: AC
  Filled 2015-11-17: qty 10

## 2015-11-17 MED ORDER — CELECOXIB 200 MG PO CAPS
200.0000 mg | ORAL_CAPSULE | Freq: Two times a day (BID) | ORAL | Status: AC
  Administered 2015-11-17 – 2015-11-21 (×10): 200 mg via ORAL
  Filled 2015-11-17 (×10): qty 1

## 2015-11-17 MED ORDER — ROCURONIUM BROMIDE 100 MG/10ML IV SOLN
INTRAVENOUS | Status: DC | PRN
  Administered 2015-11-17: 50 mg via INTRAVENOUS

## 2015-11-17 MED ORDER — DEXTROSE 5 % IV SOLN
500.0000 mg | Freq: Four times a day (QID) | INTRAVENOUS | Status: DC | PRN
  Administered 2015-11-17: 500 mg via INTRAVENOUS
  Filled 2015-11-17 (×2): qty 5

## 2015-11-17 MED ORDER — MIDAZOLAM HCL 5 MG/5ML IJ SOLN
INTRAMUSCULAR | Status: DC | PRN
Start: 2015-11-17 — End: 2015-11-17
  Administered 2015-11-17: 2 mg via INTRAVENOUS

## 2015-11-17 MED ORDER — SUGAMMADEX SODIUM 200 MG/2ML IV SOLN
INTRAVENOUS | Status: AC
  Filled 2015-11-17: qty 2

## 2015-11-17 MED ORDER — ONDANSETRON HCL 4 MG/2ML IJ SOLN
4.0000 mg | INTRAMUSCULAR | Status: DC | PRN
  Administered 2015-11-17 – 2015-11-22 (×5): 4 mg via INTRAVENOUS
  Filled 2015-11-17 (×5): qty 2

## 2015-11-17 MED ORDER — SENNA 8.6 MG PO TABS
1.0000 | ORAL_TABLET | Freq: Two times a day (BID) | ORAL | Status: DC
  Administered 2015-11-17 – 2015-11-22 (×11): 8.6 mg via ORAL
  Filled 2015-11-17 (×10): qty 1

## 2015-11-17 MED ORDER — CHLORHEXIDINE GLUCONATE 4 % EX LIQD: 60.0000 mL | Freq: Once | CUTANEOUS | Status: DC

## 2015-11-17 MED ORDER — PROPOFOL 10 MG/ML IV BOLUS
INTRAVENOUS | Status: AC
  Filled 2015-11-17: qty 20

## 2015-11-17 MED ORDER — MORPHINE SULFATE (PF) 2 MG/ML IV SOLN
1.0000 mg | INTRAVENOUS | Status: DC | PRN
  Administered 2015-11-17 – 2015-11-19 (×3): 2 mg via INTRAVENOUS
  Administered 2015-11-19: 4 mg via INTRAVENOUS
  Administered 2015-11-20: 2 mg via INTRAVENOUS
  Administered 2015-11-20: 4 mg via INTRAVENOUS
  Administered 2015-11-20 (×2): 2 mg via INTRAVENOUS
  Administered 2015-11-20: 4 mg via INTRAVENOUS
  Filled 2015-11-17 (×2): qty 1
  Filled 2015-11-17 (×2): qty 2
  Filled 2015-11-17 (×3): qty 1
  Filled 2015-11-17 (×2): qty 2
  Filled 2015-11-17: qty 1

## 2015-11-17 MED ORDER — VECURONIUM BROMIDE 10 MG IV SOLR
INTRAVENOUS | Status: AC
  Filled 2015-11-17: qty 10

## 2015-11-17 MED ORDER — SODIUM CHLORIDE 0.9 % IR SOLN
Status: DC | PRN
  Administered 2015-11-17: 08:00:00

## 2015-11-17 MED ORDER — CEFAZOLIN SODIUM 1-5 GM-% IV SOLN
1.0000 g | Freq: Three times a day (TID) | INTRAVENOUS | Status: AC
  Administered 2015-11-17 (×2): 1 g via INTRAVENOUS
  Filled 2015-11-17 (×2): qty 50

## 2015-11-17 MED ORDER — ACETAMINOPHEN 325 MG PO TABS: 650.0000 mg | ORAL_TABLET | ORAL | Status: DC | PRN

## 2015-11-17 MED ORDER — TAMSULOSIN HCL 0.4 MG PO CAPS
0.4000 mg | ORAL_CAPSULE | Freq: Every day | ORAL | Status: DC
  Administered 2015-11-17 – 2015-11-22 (×6): 0.4 mg via ORAL
  Filled 2015-11-17 (×6): qty 1

## 2015-11-17 MED ORDER — STERILE WATER FOR INJECTION IJ SOLN
INTRAMUSCULAR | Status: AC
  Filled 2015-11-17: qty 10

## 2015-11-17 MED ORDER — MENTHOL 3 MG MT LOZG: 1.0000 | LOZENGE | OROMUCOSAL | Status: DC | PRN

## 2015-11-17 MED ORDER — DEXAMETHASONE SODIUM PHOSPHATE 10 MG/ML IJ SOLN
10.0000 mg | INTRAMUSCULAR | Status: AC
  Administered 2015-11-17: 10 mg via INTRAVENOUS
  Filled 2015-11-17: qty 1

## 2015-11-17 MED ORDER — LACTATED RINGERS IV SOLN
INTRAVENOUS | Status: DC | PRN
  Administered 2015-11-17 (×3): via INTRAVENOUS

## 2015-11-17 MED ORDER — 0.9 % SODIUM CHLORIDE (POUR BTL) OPTIME
TOPICAL | Status: DC | PRN
  Administered 2015-11-17: 1000 mL

## 2015-11-17 MED ORDER — FENTANYL CITRATE (PF) 250 MCG/5ML IJ SOLN
INTRAMUSCULAR | Status: DC | PRN
  Administered 2015-11-17 (×2): 50 ug via INTRAVENOUS
  Administered 2015-11-17: 150 ug via INTRAVENOUS

## 2015-11-17 MED ORDER — METHOCARBAMOL 500 MG PO TABS
500.0000 mg | ORAL_TABLET | Freq: Four times a day (QID) | ORAL | Status: DC | PRN
  Administered 2015-11-17 – 2015-11-22 (×14): 500 mg via ORAL
  Filled 2015-11-17 (×15): qty 1

## 2015-11-17 MED ORDER — ONDANSETRON HCL 4 MG/2ML IJ SOLN
INTRAMUSCULAR | Status: DC | PRN
  Administered 2015-11-17: 4 mg via INTRAVENOUS

## 2015-11-17 MED ORDER — LIDOCAINE HCL (CARDIAC) 20 MG/ML IV SOLN
INTRAVENOUS | Status: DC | PRN
  Administered 2015-11-17: 100 mg via INTRAVENOUS

## 2015-11-17 MED ORDER — MIDAZOLAM HCL 2 MG/2ML IJ SOLN
INTRAMUSCULAR | Status: AC
  Filled 2015-11-17: qty 2

## 2015-11-17 MED ORDER — METHOCARBAMOL 1000 MG/10ML IJ SOLN
500.0000 mg | INTRAMUSCULAR | Status: AC
  Filled 2015-11-17: qty 5

## 2015-11-17 MED ORDER — PHENOL 1.4 % MT LIQD: 1.0000 | OROMUCOSAL | Status: DC | PRN

## 2015-11-17 MED ORDER — ROCURONIUM BROMIDE 50 MG/5ML IV SOLN
INTRAVENOUS | Status: AC
  Filled 2015-11-17: qty 1

## 2015-11-17 MED ORDER — LIDOCAINE HCL (CARDIAC) 20 MG/ML IV SOLN
INTRAVENOUS | Status: AC
  Filled 2015-11-17: qty 5

## 2015-11-17 MED ORDER — PROMETHAZINE HCL 25 MG/ML IJ SOLN: 6.2500 mg | INTRAMUSCULAR | Status: DC | PRN

## 2015-11-17 MED ORDER — THROMBIN 5000 UNITS EX SOLR
OROMUCOSAL | Status: DC | PRN
  Administered 2015-11-17: 08:00:00 via TOPICAL

## 2015-11-17 MED ORDER — PROMETHAZINE HCL 25 MG/ML IJ SOLN
12.5000 mg | Freq: Four times a day (QID) | INTRAMUSCULAR | Status: DC | PRN
  Administered 2015-11-17 – 2015-11-18 (×2): 12.5 mg via INTRAVENOUS
  Administered 2015-11-21: 25 mg via INTRAVENOUS
  Filled 2015-11-17 (×3): qty 1

## 2015-11-17 MED ORDER — FENTANYL CITRATE (PF) 100 MCG/2ML IJ SOLN
INTRAMUSCULAR | Status: AC
  Filled 2015-11-17: qty 2

## 2015-11-17 MED ORDER — THROMBIN 20000 UNITS EX SOLR
CUTANEOUS | Status: DC | PRN
  Administered 2015-11-17: 08:00:00 via TOPICAL

## 2015-11-17 MED ORDER — POTASSIUM CHLORIDE IN NACL 20-0.9 MEQ/L-% IV SOLN
INTRAVENOUS | Status: DC
  Administered 2015-11-17: 20:00:00 via INTRAVENOUS
  Filled 2015-11-17 (×5): qty 1000

## 2015-11-17 MED ORDER — SODIUM CHLORIDE 0.9 % IJ SOLN
3.0000 mL | Freq: Two times a day (BID) | INTRAMUSCULAR | Status: DC
  Administered 2015-11-17 – 2015-11-22 (×9): 3 mL via INTRAVENOUS

## 2015-11-17 MED ORDER — EPHEDRINE SULFATE 50 MG/ML IJ SOLN
INTRAMUSCULAR | Status: AC
  Filled 2015-11-17: qty 1

## 2015-11-17 MED ORDER — SUGAMMADEX SODIUM 200 MG/2ML IV SOLN
INTRAVENOUS | Status: DC | PRN
  Administered 2015-11-17: 200 mg via INTRAVENOUS

## 2015-11-17 MED ORDER — CEFAZOLIN SODIUM-DEXTROSE 2-3 GM-% IV SOLR
2.0000 g | INTRAVENOUS | Status: AC
  Administered 2015-11-17: 2 g via INTRAVENOUS
  Filled 2015-11-17: qty 50

## 2015-11-17 MED ORDER — SUCCINYLCHOLINE CHLORIDE 20 MG/ML IJ SOLN
INTRAMUSCULAR | Status: AC
  Filled 2015-11-17: qty 1

## 2015-11-17 MED ORDER — PHENYLEPHRINE 40 MCG/ML (10ML) SYRINGE FOR IV PUSH (FOR BLOOD PRESSURE SUPPORT)
PREFILLED_SYRINGE | INTRAVENOUS | Status: AC
  Filled 2015-11-17: qty 10

## 2015-11-17 MED ORDER — FENTANYL CITRATE (PF) 250 MCG/5ML IJ SOLN
INTRAMUSCULAR | Status: AC
  Filled 2015-11-17: qty 5

## 2015-11-17 MED ORDER — PROPOFOL 10 MG/ML IV BOLUS
INTRAVENOUS | Status: DC | PRN
  Administered 2015-11-17: 100 mg via INTRAVENOUS

## 2015-11-17 SURGICAL SUPPLY — 111 items
ADH SKN CLS APL DERMABOND .7 (GAUZE/BANDAGES/DRESSINGS) ×1
APL SKNCLS STERI-STRIP NONHPOA (GAUZE/BANDAGES/DRESSINGS)
APPLIER CLIP 11 MED OPEN (CLIP) ×3
APR CLP MED 11 20 MLT OPN (CLIP) ×1
BAG DECANTER FOR FLEXI CONT (MISCELLANEOUS) ×3 IMPLANT
BENZOIN TINCTURE PRP APPL 2/3 (GAUZE/BANDAGES/DRESSINGS) IMPLANT
BONE MATRIX OSTEOCEL PRO MED (Bone Implant) ×2 IMPLANT
BUR EGG ELITE 5.0 (BURR) IMPLANT
BUR EGG ELITE 5.0MM (BURR)
BUR MATCHSTICK NEURO 3.0 LAGG (BURR) ×2 IMPLANT
CAGE COROENT XLR 14X38X28-8 (Cage) ×2 IMPLANT
CANISTER SUCT 3000ML PPV (MISCELLANEOUS) ×3 IMPLANT
CATH COUDE FOLEY 2W 5CC 16FR (CATHETERS) ×2 IMPLANT
CLIP APPLIE 11 MED OPEN (CLIP) ×1 IMPLANT
CLOSURE WOUND 1/2 X4 (GAUZE/BANDAGES/DRESSINGS)
COVER BACK TABLE 24X17X13 BIG (DRAPES) IMPLANT
COVER BACK TABLE 60X90IN (DRAPES) ×1 IMPLANT
DERMABOND ADVANCED (GAUZE/BANDAGES/DRESSINGS) ×2
DERMABOND ADVANCED .7 DNX12 (GAUZE/BANDAGES/DRESSINGS) IMPLANT
DRAPE C-ARM 42X72 X-RAY (DRAPES) ×7 IMPLANT
DRAPE INCISE IOBAN 66X45 STRL (DRAPES) ×3 IMPLANT
DRAPE LAPAROTOMY 100X72X124 (DRAPES) ×3 IMPLANT
DRAPE POUCH INSTRU U-SHP 10X18 (DRAPES) ×3 IMPLANT
DURAPREP 26ML APPLICATOR (WOUND CARE) ×3 IMPLANT
ELECT BLADE 4.0 EZ CLEAN MEGAD (MISCELLANEOUS) ×3
ELECT REM PT RETURN 9FT ADLT (ELECTROSURGICAL) ×3
ELECTRODE BLDE 4.0 EZ CLN MEGD (MISCELLANEOUS) ×1 IMPLANT
ELECTRODE REM PT RTRN 9FT ADLT (ELECTROSURGICAL) ×1 IMPLANT
FELT TEFLON 6X6 (MISCELLANEOUS) IMPLANT
GAUZE SPONGE 4X4 12PLY STRL (GAUZE/BANDAGES/DRESSINGS) ×3 IMPLANT
GAUZE SPONGE 4X4 16PLY XRAY LF (GAUZE/BANDAGES/DRESSINGS) IMPLANT
GLOVE BIO SURGEON STRL SZ 6.5 (GLOVE) IMPLANT
GLOVE BIO SURGEON STRL SZ7 (GLOVE) ×2 IMPLANT
GLOVE BIO SURGEON STRL SZ8 (GLOVE) ×8 IMPLANT
GLOVE BIO SURGEON STRL SZ8.5 (GLOVE) ×2 IMPLANT
GLOVE BIO SURGEONS STRL SZ 6.5 (GLOVE)
GLOVE BIOGEL PI IND STRL 7.5 (GLOVE) ×1 IMPLANT
GLOVE BIOGEL PI INDICATOR 7.5 (GLOVE) ×2
GLOVE ECLIPSE 6.5 STRL STRAW (GLOVE) IMPLANT
GLOVE ECLIPSE 7.0 STRL STRAW (GLOVE) IMPLANT
GLOVE ECLIPSE 7.5 STRL STRAW (GLOVE) ×1 IMPLANT
GLOVE INDICATOR 6.5 STRL GRN (GLOVE) IMPLANT
GLOVE INDICATOR 7.0 STRL GRN (GLOVE) IMPLANT
GLOVE INDICATOR 8.0 STRL GRN (GLOVE) IMPLANT
GLOVE OPTIFIT SS 6.5 STRL BRWN (GLOVE) IMPLANT
GLOVE OPTIFIT SS 7.0 STRL BRWN (GLOVE)
GLOVE OPTIFIT SS 7.5 STRL LX (GLOVE) IMPLANT
GLOVE OPTIFIT SS 8.0 STRL (GLOVE) IMPLANT
GLOVE OPTIFIT SS 8.5 STRL (GLOVE) IMPLANT
GLOVE SS N UNI LF 7.5 STRL (GLOVE) IMPLANT
GLOVE SURG SS PI 6.5 STRL IVOR (GLOVE) IMPLANT
GLOVE SURG SS PI 7.0 STRL IVOR (GLOVE) IMPLANT
GLOVE SURG SS PI 7.5 STRL IVOR (GLOVE) IMPLANT
GLOVE SURG SS PI 8.0 STRL IVOR (GLOVE) IMPLANT
GLOVE SURG SS PI 8.5 STRL IVOR (GLOVE)
GLOVE SURG SS PI 8.5 STRL STRW (GLOVE) IMPLANT
GOWN STRL NON-REIN LRG LVL3 (GOWN DISPOSABLE) ×1 IMPLANT
GOWN STRL REUS W/ TWL LRG LVL3 (GOWN DISPOSABLE) IMPLANT
GOWN STRL REUS W/ TWL XL LVL3 (GOWN DISPOSABLE) IMPLANT
GOWN STRL REUS W/TWL 2XL LVL3 (GOWN DISPOSABLE) ×3 IMPLANT
GOWN STRL REUS W/TWL LRG LVL3 (GOWN DISPOSABLE) ×3
GOWN STRL REUS W/TWL XL LVL3 (GOWN DISPOSABLE) ×9
HEMOSTAT POWDER KIT SURGIFOAM (HEMOSTASIS) ×2 IMPLANT
INSERT FOGARTY 61MM (MISCELLANEOUS) IMPLANT
INSERT FOGARTY SM (MISCELLANEOUS) IMPLANT
KIT BASIN OR (CUSTOM PROCEDURE TRAY) ×3 IMPLANT
KIT ROOM TURNOVER OR (KITS) ×6 IMPLANT
LIQUID BAND (GAUZE/BANDAGES/DRESSINGS) ×1 IMPLANT
LOOP VESSEL MAXI BLUE (MISCELLANEOUS) ×3 IMPLANT
LOOP VESSEL MINI RED (MISCELLANEOUS) ×3 IMPLANT
NDL SPNL 18GX3.5 QUINCKE PK (NEEDLE) ×1 IMPLANT
NEEDLE SPNL 18GX3.5 QUINCKE PK (NEEDLE) IMPLANT
NS IRRIG 1000ML POUR BTL (IV SOLUTION) ×3 IMPLANT
PACK LAMINECTOMY NEURO (CUSTOM PROCEDURE TRAY) ×3 IMPLANT
PAD ARMBOARD 7.5X6 YLW CONV (MISCELLANEOUS) ×6 IMPLANT
PIN FIXATION BRIGADE 20MM THRD (PIN) ×2 IMPLANT
PLATE BRIGADE LORDOTIC 38MM (Plate) ×2 IMPLANT
SCREW BRIGADE 5.5X27.5MM (Screw) ×4 IMPLANT
SCREW PLATE BRIGADE 5.5X30.0MM (Screw) ×4 IMPLANT
SPONGE INTESTINAL PEANUT (DISPOSABLE) ×8 IMPLANT
SPONGE LAP 18X18 X RAY DECT (DISPOSABLE) ×3 IMPLANT
SPONGE LAP 4X18 X RAY DECT (DISPOSABLE) IMPLANT
STAPLER VISISTAT 35W (STAPLE) IMPLANT
STRIP CLOSURE SKIN 1/2X4 (GAUZE/BANDAGES/DRESSINGS) IMPLANT
SUT PROLENE 4 0 RB 1 (SUTURE)
SUT PROLENE 4-0 RB1 .5 CRCL 36 (SUTURE) ×4 IMPLANT
SUT PROLENE 5 0 CC1 (SUTURE) IMPLANT
SUT PROLENE 6 0 C 1 30 (SUTURE) ×1 IMPLANT
SUT PROLENE 6 0 CC (SUTURE) IMPLANT
SUT SILK 0 TIES 10X30 (SUTURE) ×1 IMPLANT
SUT SILK 2 0 TIES 10X30 (SUTURE) ×2 IMPLANT
SUT SILK 2 0SH CR/8 30 (SUTURE) IMPLANT
SUT SILK 3 0 TIES 10X30 (SUTURE) ×1 IMPLANT
SUT SILK 3 0 TIES 17X18 (SUTURE)
SUT SILK 3 0SH CR/8 30 (SUTURE) IMPLANT
SUT SILK 3-0 18XBRD TIE BLK (SUTURE) IMPLANT
SUT VIC AB 0 CT1 18XCR BRD8 (SUTURE) ×1 IMPLANT
SUT VIC AB 0 CT1 27 (SUTURE) ×6
SUT VIC AB 0 CT1 27XBRD ANBCTR (SUTURE) ×3 IMPLANT
SUT VIC AB 0 CT1 8-18 (SUTURE)
SUT VIC AB 2-0 CP2 18 (SUTURE) ×3 IMPLANT
SUT VIC AB 2-0 CTB1 (SUTURE) ×1 IMPLANT
SUT VIC AB 3-0 SH 27 (SUTURE)
SUT VIC AB 3-0 SH 27X BRD (SUTURE) ×1 IMPLANT
SUT VIC AB 3-0 SH 8-18 (SUTURE) ×5 IMPLANT
SUT VICRYL 4-0 PS2 18IN ABS (SUTURE) ×1 IMPLANT
TOWEL OR 17X24 6PK STRL BLUE (TOWEL DISPOSABLE) ×12 IMPLANT
TOWEL OR 17X26 10 PK STRL BLUE (TOWEL DISPOSABLE) ×4 IMPLANT
TRAY FOLEY W/METER SILVER 14FR (SET/KITS/TRAYS/PACK) ×1 IMPLANT
TRAY FOLEY W/METER SILVER 16FR (SET/KITS/TRAYS/PACK) ×2 IMPLANT
WATER STERILE IRR 1000ML POUR (IV SOLUTION) ×3 IMPLANT

## 2015-11-17 NOTE — H&P (View-Only) (Signed)
[facility]  Patient name: Austin JubaRandy Bolton MRN: [id number] DOB: 1961-02-25 Sex: male  REASON FOR CONSULT: Evaluate for anterior retroperitoneal exposure of L5-S1. Referred by Dr. Marikay Alaravid Jones.  HPI: Austin Bolton is a 55 y.o. male, who has a long history of low back pain. This has been worse over the last year and a half. He has tried physical therapy, injection therapy, and pain medicine without significant relief. He has undergone previous back surgery via a posterior approach. On 04/06/2015, he had microlumbar decompression at L5-S1, foraminotomies at L5-S1, and  microdiscectomy at L5-S1. This was by Dr.  Paula LibraJeff Beane. He has been evaluated for anterior lumbar interbody fusion by Dr. Marikay Alaravid Jones and I have been asked to provide anterior retroperitoneal exposure.  He has had a previous cholecystectomy.  He has no real risk factors for peripheral vascular disease. He denies diabetes, hypertension, hypercholesterolemia, any family history of premature cardiovascular disease, or tobacco use. He denies any history of claudication or rest pain.  Past Medical History  Diagnosis Date  . History of kidney stones   . Enlarged aorta (HCC) ASYMPTOMATIC--  NO MEDS  . History of concussion CHILD--  NO RESIDUAL  . Urgency of urination   . Nocturia   . Bicuspid aortic valve     "no problems"  . DDD (degenerative disc disease), lumbar   . HNP (herniated nucleus pulposus)     "at least one"  . Headache     hx of migraines, none recent  . Anemia     hx of  . Depression     no medication-years ago attempted suicide x1  . Elliptocytosis (HCC)   . PONV (postoperative nausea and vomiting)   . Blood dyscrasia     Family History  Problem Relation Age of Onset  . Colon cancer Mother     SOCIAL HISTORY: Social History   Social History  . Marital Status: Married    Spouse Name: N/A  . Number of Children: N/A  . Years of Education: N/A   Occupational History  . Not on  file.   Social History Main Topics  . Smoking status: Never Smoker   . Smokeless tobacco: Never Used  . Alcohol Use: No  . Drug Use: Yes    Special: Marijuana     Comment: OCCASIONAL USE OF MARIJUANA--   LAST USED FEW WEEKS AGO   . Sexual Activity: Not on file   Other Topics Concern  . Not on file   Social History Narrative    No Known Allergies  Current Outpatient Prescriptions  Medication Sig Dispense Refill  . ibuprofen (ADVIL,MOTRIN) 200 MG tablet Take 4 tablets (800 mg total) by mouth every 6 (six) hours as needed for headache or moderate pain. May resume 4 days post-op (Patient taking differently: Take 800 mg by mouth every 6 (six) hours as needed for headache or moderate pain. ) 30 tablet 0  . mupirocin ointment (BACTROBAN) 2 % 2 (two) times daily. Nose    . oxyCODONE-acetaminophen (PERCOCET) 5-325 MG per tablet Take 1 tablet by mouth every 4 (four) hours as needed. (Patient taking differently: Take 1 tablet by mouth every 4 (four) hours as needed for moderate pain. ) 40 tablet 0   No current facility-administered medications for this visit.    REVIEW OF SYSTEMS:  [X]  denotes positive finding, [ ]  denotes negative finding Cardiac  Comments:  Chest pain or chest pressure:    Shortness of breath upon exertion:  Short of breath when lying flat:    Irregular heart rhythm:        Vascular    Pain in calf, thigh, or hip brought on by ambulation:    Pain in feet at night that wakes you up from your sleep:     Blood clot in your veins:    Leg swelling:         Pulmonary    Oxygen at home:    Productive cough:     Wheezing:         Neurologic    Sudden weakness in arms or legs:     Sudden numbness in arms or legs:     Sudden onset of difficulty speaking or slurred speech:    Temporary loss of vision in one eye:     Problems with dizziness:         Gastrointestinal    Blood in stool:     Vomited blood:         Genitourinary    Burning when urinating:       Blood in urine:        Psychiatric    Major depression:         Hematologic    Bleeding problems:    Problems with blood clotting too easily:        Skin    Rashes or ulcers:        Constitutional    Fever or chills:      PHYSICAL EXAM: Filed Vitals:   11/09/15 1517  BP: 114/85  Pulse: 87  Temp: 97.9 F (36.6 C)  TempSrc: Oral  Resp: 16  Height: 6\' 2"  (1.88 m)  Weight: 198 lb (89.812 kg)  SpO2: 100%    GENERAL: The patient is a well-nourished male, in no acute distress. The vital signs are documented above. CARDIAC: There is a regular rate and rhythm.  VASCULAR: I do not detect carotid bruits. He has palpable femoral pulses and palpable pedal pulses bilaterally. PULMONARY: There is good air exchange bilaterally without wheezing or rales. ABDOMEN: Soft and non-tender with normal pitched bowel sounds.  MUSCULOSKELETAL: There are no major deformities or cyanosis. NEUROLOGIC: No focal weakness or paresthesias are detected. SKIN: There are no ulcers or rashes noted. PSYCHIATRIC: The patient has a normal affect.  DATA:  I have reviewed his CT scan. There is not much angulation to the L5-S1 disc space. The left common iliac vein is at the superior aspect of the disc.  MEDICAL ISSUES:  DEGENERATIVE DISC DISEASE L5-S1: The patient appears to be a reasonable candidate for anterior retroperitoneal exposure of L5-S1. This is scheduled for 11/17/2015. I have reviewed our role in exposure of the spine in order to allow anterior lumbar interbody fusion at the appropriate levels. We have discussed the potential complications of surgery, including but not limited to, arterial or venous injury, thrombosis, or bleeding. We have also discussed the potential risks of wound healing problems, the development of a hernia, nerve injury, leg swelling, or other unpredictable medical problems. I've also explained that for the L5-S1 level there is a small risk of retrograde ejaculation. All the  patient's questions were answered and they are agreeable to proceed.  Waverly Ferrari [facility] [place] [facility]: [phone]

## 2015-11-17 NOTE — Anesthesia Procedure Notes (Signed)
Procedure Name: Intubation Date/Time: 11/17/2015 8:26 AM Performed by: Jerilee HohMUMM, Avrom Robarts N Pre-anesthesia Checklist: Patient identified, Emergency Drugs available, Patient being monitored and Suction available Patient Re-evaluated:Patient Re-evaluated prior to inductionOxygen Delivery Method: Circle system utilized Preoxygenation: Pre-oxygenation with 100% oxygen Intubation Type: IV induction Ventilation: Mask ventilation without difficulty and Oral airway inserted - appropriate to patient size Laryngoscope Size: Mac and 4 Grade View: Grade II Tube type: Oral Tube size: 7.5 mm Number of attempts: 1 Airway Equipment and Method: Stylet Placement Confirmation: ETT inserted through vocal cords under direct vision,  positive ETCO2 and breath sounds checked- equal and bilateral Secured at: 22 cm Tube secured with: Tape Dental Injury: Teeth and Oropharynx as per pre-operative assessment

## 2015-11-17 NOTE — Anesthesia Postprocedure Evaluation (Signed)
Anesthesia Post Note  Patient: Austin Bolton  Procedure(s) Performed: Procedure(s) (LRB): LUMBAR FIVE SACRAL ONE ANTERIOR LUMBAR INTERBODY FUSIONL (N/A) ABDOMINAL EXPOSURE (N/A)  Patient location during evaluation: PACU Anesthesia Type: General Level of consciousness: awake and alert, awake, oriented and patient cooperative Pain management: pain level controlled Vital Signs Assessment: post-procedure vital signs reviewed and stable Respiratory status: spontaneous breathing, nonlabored ventilation, respiratory function stable and patient connected to nasal cannula oxygen Cardiovascular status: blood pressure returned to baseline and stable Postop Assessment: no signs of nausea or vomiting Anesthetic complications: no    Last Vitals:  Filed Vitals:   11/17/15 1330 11/17/15 1354  BP:  139/89  Pulse:  89  Temp: 36.2 C 36.7 C  Resp:  20    Last Pain:  Filed Vitals:   11/17/15 1516  PainSc: Asleep                 Cecile HearingStephen Edward Turk

## 2015-11-17 NOTE — Progress Notes (Signed)
Call to radiology for CXR

## 2015-11-17 NOTE — Anesthesia Preprocedure Evaluation (Addendum)
Anesthesia Evaluation  Patient identified by MRN, date of birth, ID band Patient awake    Reviewed: Allergy & Precautions, NPO status , Patient's Chart, lab work & pertinent test results  History of Anesthesia Complications (+) PONV and history of anesthetic complications  Airway Mallampati: II  TM Distance: <3 FB Neck ROM: Full    Dental  (+) Teeth Intact, Dental Advisory Given   Pulmonary neg pulmonary ROS,    Pulmonary exam normal breath sounds clear to auscultation       Cardiovascular Exercise Tolerance: Good + Peripheral Vascular Disease  Normal cardiovascular exam Rhythm:Regular Rate:Normal  EKG 11/08/15: NSR. Possible LAE.   Nuclear stress test 11/01/15:  - No inducible ischemia noted on this study. Normal EF 68%  Echo 08/22/15:  - LV cavity normal in size. Normal global wall motion. Ef 65% - Aortic valve appears bicuspid with mild sclerosis. Mild regurgitation and small gradient across the valve - Trace MR - Mild TR - Mild pulmonary HTN - Aortic root and ascending aorta are mildly dilated (4.9 cm).    Neuro/Psych  Headaches, PSYCHIATRIC DISORDERS Depression    GI/Hepatic negative GI ROS, Neg liver ROS,   Endo/Other  negative endocrine ROS  Renal/GU negative Renal ROS     Musculoskeletal  (+) Arthritis , Osteoarthritis,    Abdominal   Peds  Hematology negative hematology ROS (+)   Anesthesia Other Findings Day of surgery medications reviewed with the patient.  Reproductive/Obstetrics                          Anesthesia Physical Anesthesia Plan  ASA: II  Anesthesia Plan: General   Post-op Pain Management:    Induction: Intravenous  Airway Management Planned: Oral ETT  Additional Equipment: Arterial line  Intra-op Plan:   Post-operative Plan: Extubation in OR  Informed Consent: I have reviewed the patients History and Physical, chart, labs and discussed the  procedure including the risks, benefits and alternatives for the proposed anesthesia with the patient or authorized representative who has indicated his/her understanding and acceptance.   Dental advisory given  Plan Discussed with: CRNA  Anesthesia Plan Comments: (Risks/benefits of general anesthesia discussed with patient including risk of damage to teeth, lips, gum, and tongue, nausea/vomiting, allergic reactions to medications, and the possibility of heart attack, stroke and death.  All patient questions answered.  Patient wishes to proceed.)       Anesthesia Quick Evaluation

## 2015-11-17 NOTE — Progress Notes (Signed)
Orders received from Dr.Turk to d/c aline. No need to transduce in PACU.

## 2015-11-17 NOTE — Progress Notes (Signed)
PT Cancellation Note  Patient Details Name: Austin JubaRandy Arceneaux MRN: [id number] DOB: May 12, 1961   Cancelled Treatment:    Reason Eval/Treat Not Completed: Pt not feeling up to OOB with therapy at this time. Has just received pain medication. Will continue to follow and complete PT eval when pt is able.     Conni SlipperKirkman, Anaiah Mcmannis 11/17/2015, 2:26 PM   Conni SlipperLaura Roxy Mastandrea, PT, DPT Acute Rehabilitation Services Pager: [phone]

## 2015-11-17 NOTE — Interval H&P Note (Signed)
History and Physical Interval Note:  11/17/2015 7:31 AM  Austin Bolton  has presented today for surgery, with the diagnosis of Post laminectomy syndrome  The various methods of treatment have been discussed with the patient and family. After consideration of risks, benefits and other options for treatment, the patient has consented to  Procedure(s): L5/S1 ALIF (N/A) ABDOMINAL EXPOSURE (N/A) as a surgical intervention .  The patient's history has been reviewed, patient examined, no change in status, stable for surgery.  I have reviewed the patient's chart and labs.  Questions were answered to the patient's satisfaction.     Waverly Ferrariickson, Lateisha Thurlow

## 2015-11-17 NOTE — OR Nursing (Signed)
Xray no objects noted xray fine per DR Raylene MiyamotoViazey

## 2015-11-17 NOTE — Op Note (Signed)
    NAME: Austin Bolton    MRN: [id number] DOB: August 20, 1961    DATE OF OPERATION: 11/17/2015  PREOP DIAGNOSIS: Degenerative disc disease L5-S1  POSTOP DIAGNOSIS: same  PROCEDURE: Anterior retroperitoneal exposure of L5-S1  EXPOSURE SURGEON: Di Kindlehristopher S. Edilia Boickson, MD, FACS  SPINE ASSIST: Marikay Alaravid Jones M.D.  ANESTHESIA: gen.   EBL: minimal  INDICATIONS: Austin JubaRandy Stanzione is a 55 y.o. male with significant degenerative disc disease L5-S1. He was felt to be a good candidate for anterior lumbar interbody fusion. I was asked to provide anterior rectal perineal exposure.  FINDINGS: The iliac vessels were soft and free of significant disease.  TECHNIQUE: the patient was taken to the operating room  And monitoring lines were placed by anesthesia. Pulse oximeter was placed on the left great toe.The level of the L5-S1 disc space was marked under fluoroscopy. The abdomen was prepped and draped in the usual sterile fashion.  A transverse incision was made to the left of the midline at the marked level. Dissection was carried down to the anterior rectus sheath. The anterior rectus sheath was divided transversely.The anterior rectus sheath was then mobilized superiorly and inferiorly and then the left rectus abdominous muscle mobilized circumferentially. It was retracted medially. This allowed exposure of the retroperitoneal space which was mobilized to the right and superiorly. The psoas muscle was visualized and the iliac vessels were visualized. The dissection was continued allowing exposure of the L5-S1 disc space.The middle sacral vessels were clipped and divided. Using blunt dissection the L5-S1 disc space was dissected free enough that the reverse lip retractors could be placed for the [facility] retractor.This allowed excellent exposure of L5-S1. Position was confirmed under fluoroscopy. The remainder of the dictation is as per Dr. Yetta BarreJones.  Waverly Ferrarihristopher Dickson, MD, FACS [facility]  DATE OF DICTATION:   11/17/2015

## 2015-11-17 NOTE — Transfer of Care (Signed)
Immediate Anesthesia Transfer of Care Note  Patient: Austin Bolton  Procedure(s) Performed: Procedure(s): LUMBAR FIVE SACRAL ONE ANTERIOR LUMBAR INTERBODY FUSIONL (N/A) ABDOMINAL EXPOSURE (N/A)  Patient Location: PACU  Anesthesia Type:General  Level of Consciousness: awake, alert , oriented and patient cooperative  Airway & Oxygen Therapy: Patient Spontanous Breathing and Patient connected to nasal cannula oxygen  Post-op Assessment: Report given to RN, Post -op Vital signs reviewed and stable and Patient moving all extremities  Post vital signs: Reviewed and stable  Last Vitals:  Filed Vitals:   11/17/15 0555  BP: 128/90  Pulse: 70  Temp: 37.1 C  Resp: 18    Complications: No apparent anesthesia complications

## 2015-11-17 NOTE — H&P (Signed)
Subjective: Patient is a 55 y.o. male admitted for ALIF. S/P L5-S1 surgery by another surgeon. Has failed back syndrome. Onset of symptoms was several months ago, gradually worsening since that time.  The pain is rated severe, and is located at the across the lower back and radiates to legs. The pain is described as aching and occurs all day. The symptoms have been progressive. Symptoms are exacerbated by exercise. MRI or CT showed DDD L5_S1   Past Medical History  Diagnosis Date  . History of kidney stones   . Enlarged aorta (HCC) ASYMPTOMATIC--  NO MEDS  . History of concussion CHILD--  NO RESIDUAL  . Urgency of urination   . Nocturia   . Bicuspid aortic valve     "no problems"  . DDD (degenerative disc disease), lumbar   . HNP (herniated nucleus pulposus)     "at least one"  . Headache     hx of migraines, none recent  . Anemia     hx of  . Depression     no medication-years ago attempted suicide x1  . Elliptocytosis (HCC)   . PONV (postoperative nausea and vomiting)   . Blood dyscrasia     Past Surgical History  Procedure Laterality Date  . Right ureteroscopic [facility] / stent [facility]  . Ureterolithotomy  2001  . Knee surgery  1978    RIGHT  . Cystoscopy w/ ureteral stent removal  05/13/2012    Procedure: CYSTOSCOPY WITH STENT REMOVAL;  Surgeon: Anner CreteJohn J Wrenn, MD;  [facility];  Service: Urology;  Laterality: Right;  flexible cystoscope  . Cholecystectomy  ~1992  . Lumbar laminectomy/decompression microdiscectomy Left 04/06/2015    Procedure: MICRO LUMBAR DECOMPRESSION  L5-S1 ON LEFT;  Surgeon: Jene EveryJeffrey Beane, MD;  Location: [facility];  Service: Orthopedics;  Laterality: Left;    Prior to Admission medications   Medication Sig Start Date End Date Taking? Authorizing Provider  ibuprofen (ADVIL,MOTRIN) 200 MG tablet Take 4 tablets (800 mg total) by mouth every 6 (six) hours as needed for headache or moderate pain. May resume  4 days post-op Patient taking differently: Take 800 mg by mouth every 6 (six) hours as needed for headache or moderate pain.  04/07/15  Yes Dorothy SparkJaclyn M Bissell, PA-C  oxyCODONE-acetaminophen (PERCOCET) 5-325 MG per tablet Take 1 tablet by mouth every 4 (four) hours as needed. Patient taking differently: Take 1 tablet by mouth every 4 (four) hours as needed for moderate pain.  04/06/15  Yes Jene EveryJeffrey Beane, MD  mupirocin ointment (BACTROBAN) 2 % 2 (two) times daily. Nose 11/08/15   Historical Provider, MD   No Known Allergies  Social History  Substance Use Topics  . Smoking status: Never Smoker   . Smokeless tobacco: Never Used  . Alcohol Use: No    Family History  Problem Relation Age of Onset  . Colon cancer Mother      Review of Systems  Positive ROS: neg  All other systems have been reviewed and were otherwise negative with the exception of those mentioned in the HPI and as above.  Objective: Vital signs in last 24 hours: Temp:  [98.8 F (37.1 C)] 98.8 F (37.1 C) (01/12 0555) Pulse Rate:  [70] 70 (01/12 0555) Resp:  [18] 18 (01/12 0555) BP: (128)/(90) 128/90 mmHg (01/12 0555) SpO2:  [100 %] 100 % (01/12 0555) Weight:  [89.812 kg (198 lb)] 89.812 kg (198 lb) (01/12 0555)  General Appearance: Alert, cooperative,  no distress, appears stated age Head: Normocephalic, without obvious abnormality, atraumatic Eyes: PERRL, conjunctiva/corneas clear, EOM's intact    Neck: Supple, symmetrical, trachea midline Back: Symmetric, no curvature, ROM normal, no CVA tenderness Lungs:  respirations unlabored Heart: Regular rate and rhythm Abdomen: Soft, non-tender Extremities: Extremities normal, atraumatic, no cyanosis or edema Pulses: 2+ and symmetric all extremities Skin: Skin color, texture, turgor normal, no rashes or lesions  NEUROLOGIC:   Mental status: Alert and oriented x4,  no aphasia, good attention span, fund of knowledge, and memory Motor Exam - grossly normal Sensory Exam -  grossly normal Reflexes: 1+ Coordination - grossly normal Gait - grossly normal Balance - grossly normal Cranial Nerves: I: smell Not tested  II: visual acuity  OS: nl    OD: nl  II: visual fields Full to confrontation  II: pupils Equal, round, reactive to light  III,VII: ptosis None  III,IV,VI: extraocular muscles  Full ROM  V: mastication Normal  V: facial light touch sensation  Normal  V,VII: corneal reflex  Present  VII: facial muscle function - upper  Normal  VII: facial muscle function - lower Normal  VIII: hearing Not tested  IX: soft palate elevation  Normal  IX,X: gag reflex Present  XI: trapezius strength  5/5  XI: sternocleidomastoid strength 5/5  XI: neck flexion strength  5/5  XII: tongue strength  Normal    Data Review Lab Results  Component Value Date   WBC 4.0 11/08/2015   HGB 14.1 11/08/2015   HCT 40.6 11/08/2015   MCV 88.5 11/08/2015   PLT 180 11/08/2015   Lab Results  Component Value Date   NA 140 11/08/2015   K 4.8 11/08/2015   CL 106 11/08/2015   CO2 28 11/08/2015   BUN 8 11/08/2015   CREATININE 1.05 11/08/2015   GLUCOSE 109* 11/08/2015   Lab Results  Component Value Date   INR 1.08 11/08/2015    Assessment/Plan: Patient admitted for ALIF L5-S1. Patient has failed a reasonable attempt at conservative therapy.  I explained the condition and procedure to the patient and answered any questions.  Patient wishes to proceed with procedure as planned. Understands risks/ benefits and typical outcomes of procedure.   Mar Zettler S 11/17/2015 6:15 AM

## 2015-11-17 NOTE — Progress Notes (Signed)
Utilization review completed.  

## 2015-11-17 NOTE — Op Note (Signed)
11/17/2015  10:21 AM  PATIENT:  Austin Bolton  55 y.o. male  PRE-OPERATIVE DIAGNOSIS:  Failed back syndrome with degenerative disc disease L5-S1, back and leg pain  POST-OPERATIVE DIAGNOSIS:  Same  PROCEDURE:  Anterior lumbar interbody fusion L5-S1 utilizing a 14 mm peek interbody cage packed with morcellized allograft and aNuvasive anterior lumbar plate  SURGEON:  Marikay Alaravid Shawn Carattini, MD  Co-surgeon: Dr. Edilia Boickson  ANESTHESIA:   General  EBL: 50 ml  Total I/O In: -  Out: 225 [Urine:175; Blood:50]  BLOOD ADMINISTERED:none  DRAINS: none   SPECIMEN:  No Specimen  INDICATION FOR PROCEDURE: This patient underwent a microdiscectomy by another surgeon months ago. He had severe postoperative back pain. Discogram was positive at L5-S1 with normal controls. He was diagnosed with failed back syndrome. I recommended anterior lumbar interbody fusion L5-S1. Patient understood the risks, benefits, and alternatives and potential outcomes and wished to proceed.  PROCEDURE DETAILS: The patient was taken to the operating room and after induction of adequate general endotracheal anesthesia he was placed in the supine position on the operating room table his abdomen was cleaned and prepped and draped by the vascular surgeon. The vascular surgeon did the exposure to the L5-S1 disc space. This will be described in a separate operative report. A needle was placed in the midline at L5-S1 in the midline was marked after confirming midline and the level with AP and lateral fluoroscopy. The annulus was incised and the discectomy was performed with pituitary rongeurs and curved curettes. The endplates with curettes and with the high-speed drill. I cleaned out the lateral corners. I used the 10 and 12 mm trial followed by the 14 mm 8 trial. The last trial fit the best. We then packed a 14 mm 8 peek interbody cage with morcellized allograft and tapped this into position at L5-S1. I then used a 38 mm plate and placed 30 mm  screws into the vertebral bodies of L4 and L5 and locked these into the plate and then irrigated with same solution. I removed the retractor and checked for any bleeding. It looked quite good. We checked our final construct with AP and left fluoroscopy and then performed our standard AP abdominal x-ray to make sure there were no retained instruments or sponges. We then closed the rectus fascia with running 0 Vicryl. The subcutaneous tissues were closed with 2-0 Vicryl and subcuticular tissue was closed with 0 Vicryl. The skin was closed with Dermabond. At the end of the procedure all  sponge needle and instrument counts were correct. The patient was awakened and taken to the recovery room in stable condition.  PLAN OF CARE: Admit to inpatient   PATIENT DISPOSITION:  PACU - hemodynamically stable.   Delay start of Pharmacological VTE agent (>24hrs) due to surgical blood loss or risk of bleeding:  yes

## 2015-11-18 ENCOUNTER — Encounter (HOSPITAL_COMMUNITY): Payer: Self-pay | Admitting: Neurological Surgery

## 2015-11-18 NOTE — Progress Notes (Signed)
   VASCULAR SURGERY ASSESSMENT & PLAN:  * 1 Day Post-Op s/p: Anterior retroperitoneal exposure of L5-S1  *  Doing well. Vascular surgery will be available as needed.  SUBJECTIVE: Pain well controlled.  PHYSICAL EXAM: Filed Vitals:   11/17/15 1607 11/17/15 2145 11/17/15 2340 11/18/15 0400  BP: 148/81 104/60 100/67 107/61  Pulse: 84 71 77 83  Temp: 97.7 F (36.5 C) 97.9 F (36.6 C) 97.8 F (36.6 C) 98.1 F (36.7 C)  TempSrc:  Oral Oral Oral  Resp: 18 18 18 18   Height:      Weight:      SpO2: 99% 100% 97% 99%   Palpable left dorsalis pedis pulse. Incision looks fine.  Active Problems:   S/P lumbar spinal fusion  Cari CarawayChris Lene Mckay Beeper: [phone] 11/18/2015

## 2015-11-18 NOTE — Progress Notes (Signed)
Patient ID: Austin Bolton, male   DOB: [birth year], 55 y.o.   MRN: [id number] Subjective: Patient reports appropriate postoperative back soreness. He doesn't have much abdominal pain. Minimal flatus. No nausea. No leg pain. Some burning and hesitancy with urination, likely urethral irritation secondary to catheter.  Objective: Vital signs in last 24 hours: Temp:  [97 F (36.1 C)-98.1 F (36.7 C)] 97.9 F (36.6 C) (01/13 0755) Pulse Rate:  [60-89] 63 (01/13 0755) Resp:  [18-53] 18 (01/13 0755) BP: (100-148)/(59-95) 103/59 mmHg (01/13 0755) SpO2:  [97 %-100 %] 100 % (01/13 0755)  Intake/Output from previous day: 01/12 0701 - 01/13 0700 In: 2480 [P.O.:480; I.V.:2000] Out: 1125 [Urine:675; Emesis/NG output:400; Blood:50] Intake/Output this shift:    Neurologic: Grossly normal  Lab Results: Lab Results  Component Value Date   WBC 4.0 11/08/2015   HGB 14.1 11/08/2015   HCT 40.6 11/08/2015   MCV 88.5 11/08/2015   PLT 180 11/08/2015   Lab Results  Component Value Date   INR 1.08 11/08/2015   BMET Lab Results  Component Value Date   NA 140 11/08/2015   K 4.8 11/08/2015   CL 106 11/08/2015   CO2 28 11/08/2015   GLUCOSE 109* 11/08/2015   BUN 8 11/08/2015   CREATININE 1.05 11/08/2015   CALCIUM 9.5 11/08/2015    Studies/Results: Dg Chest 1 View  11/17/2015  CLINICAL DATA:  Preoperative chest x-ray . EXAM: CHEST 1 VIEW COMPARISON:  11/08/2015. FINDINGS: Mediastinum and hilar structures normal. Lungs are clear. Previously identified nodular opacities over the lung bases represent nipple shadows as evidenced by nipple markers. Heart size normal. No pleural effusion or pneumothorax. Degenerative changes thoracic spine and both shoulders. IMPRESSION: No acute cardiopulmonary disease. Previously identified nodular densities of the lung bases represent nipple shadows. Electronically Signed   By: Maisie Fushomas  Register   On: 11/17/2015 08:12   Dg Lumbar Spine 2-3 Views  11/17/2015  CLINICAL  DATA:  L5-S1 anterior interbody fusion EXAM: DG C-ARM 61-120 MIN; LUMBAR SPINE - 2-3 VIEW COMPARISON:  Postoperative portable supine image of today's date FINDINGS: Two intraoperative fluoro spot images are submitted. The patient has undergone placement of the anterior and intradiscal fusion device2 at L5-S1. Radiographic positioning of the prosthetic components is good. The reported fluoro time is 36 seconds. IMPRESSION: There is no immediate postprocedure complication on these 2 fluoro spot images. Electronically Signed   By: Adeleigh Barletta  [place] M.D.   On: 11/17/2015 10:37   Dg C-arm 1-60 Min  11/17/2015  CLINICAL DATA:  L5-S1 anterior interbody fusion EXAM: DG C-ARM 61-120 MIN; LUMBAR SPINE - 2-3 VIEW COMPARISON:  Postoperative portable supine image of today's date FINDINGS: Two intraoperative fluoro spot images are submitted. The patient has undergone placement of the anterior and intradiscal fusion device2 at L5-S1. Radiographic positioning of the prosthetic components is good. The reported fluoro time is 36 seconds. IMPRESSION: There is no immediate postprocedure complication on these 2 fluoro spot images. Electronically Signed   By: Lise Pincus  [place] M.D.   On: 11/17/2015 10:37   Dg Or Local Abdomen  11/17/2015  CLINICAL DATA:  Lumbar fusion.  Incorrect instrument count. EXAM: OR LOCAL ABDOMEN COMPARISON:  CT 10/04/2015. FINDINGS: 1001 hours. Single AP view demonstrates evidence of anterior fusion at L5-S1. No retained instruments are identified. No acute osseous findings are seen. There are bilateral pelvic calcifications consistent with phleboliths. IMPRESSION: No evidence of retained surgical instrument. These results were called by telephone at the time of interpretation on 11/17/2015 at 10:20 am to the  patient's operating room. Electronically Signed   By: Carey Bullocks M.D.   On: 11/17/2015 10:22    Assessment/Plan: Overall think he is doing well. He will likely go home tomorrow if we are able to  advance his diet. Continue to mobilize. Continue clear liquids until flatus and then advance diet to regular diet.   LOS: 1 day    Terrius Gentile S 11/18/2015, 9:05 AM

## 2015-11-18 NOTE — Evaluation (Signed)
Occupational Therapy Evaluation Patient Details Name: Austin Bolton MRN: [id number] DOB: 09-Jun-1961 Today's Date: 11/18/2015    History of Present Illness 55 yo male s/p ALIF L 5-s1 PMH: kidney stones, DDD, HA, depression, anemia, R knee surg, June 2016 back surg   Clinical Impression   Patient is s/p L5-s1 surgery resulting in functional limitations due to the deficits listed below (see OT problem list). Patient will have wife (A) upon d/c home for 1 week.  Patient will benefit from skilled OT acutely to increase independence and safety with ADLS to allow discharge home without follow up.  Patient plans to borrow shower seat    Follow Up Recommendations  No OT follow up    Equipment Recommendations  None recommended by OT    Recommendations for Other Services       Precautions / Restrictions Precautions Precautions: Back Precaution Comments: handout provided for back precautions Required Braces or Orthoses: Spinal Brace Spinal Brace: Lumbar corset;Applied in sitting position      Mobility Bed Mobility Overal bed mobility: Modified Independent             General bed mobility comments: correct rolling sequence and hand rail removed  Transfers Overall transfer level: Needs assistance Equipment used: Rolling walker (2 wheeled) Transfers: Sit to/from Stand Sit to Stand: Min guard         General transfer comment: pulling on RW and needs incr time to power up    Balance                                            ADL Overall ADL's : Needs assistance/impaired     Grooming: Wash/dry face;Minimal assistance;Standing Grooming Details (indicate cue type and reason): pt only releasing with one hand, pt reports "i am scared to let go" Pt terminating task quickly         Upper Body Dressing : Minimal assistance;Sitting Upper Body Dressing Details (indicate cue type and reason): don back brace and educated on sequence to don brace      Toilet Transfer: Min Administrator Details (indicate cue type and reason): pulling on RW with mod cues for hand placement. pt with decr power up into standing.          Functional mobility during ADLs: Min guard;Rolling walker General ADL Comments: Pt educated on side stepping inside the brace to navigate the room. Pt benefits from RW to help with upright posture. Pt provided urinal by patients request. Pt states "i have to walk all the way in there and i could just use a urinal here" .      Vision     Perception     Praxis      Pertinent Vitals/Pain Pain Assessment: Faces Faces Pain Scale: Hurts whole lot Pain Location: back incision Pain Descriptors / Indicators: Discomfort Pain Intervention(s): Monitored during session;Repositioned;RN gave pain meds during session     Hand Dominance Right   Extremity/Trunk Assessment Upper Extremity Assessment Upper Extremity Assessment: Overall WFL for tasks assessed   Lower Extremity Assessment Lower Extremity Assessment: Defer to PT evaluation   Cervical / Trunk Assessment Cervical / Trunk Assessment: Other exceptions (x2 surg)   Communication Communication Communication: No difficulties   Cognition Arousal/Alertness: Awake/alert Behavior During Therapy: WFL for tasks assessed/performed Overall Cognitive Status: Within Functional Limits for tasks assessed  General Comments       Exercises       Shoulder Instructions      Home Living Family/patient expects to be discharged to:: Private residence Living Arrangements: Spouse/significant other Available Help at Discharge: Family Type of Home: House Home Access: Stairs to enter Secretary/administratorntrance Stairs-Number of Steps: 2 Entrance Stairs-Rails: None Home Layout: Two level Alternate Level Stairs-Number of Steps: 10 Alternate Level Stairs-Rails: Right Bathroom Shower/Tub: Tub/shower unit Shower/tub characteristics: Curtain FirefighterBathroom Toilet:  Handicapped height Bathroom Accessibility: Yes   Home Equipment: Environmental consultantWalker - 2 wheels          Prior Functioning/Environment Level of Independence: Independent        Comments: pt reports after last surgery stayed in room for 3 weeks on second floor with wife (A)    OT Diagnosis: Generalized weakness;Acute pain   OT Problem List: Decreased strength;Decreased activity tolerance;Impaired balance (sitting and/or standing);Decreased safety awareness;Decreased knowledge of use of DME or AE;Decreased knowledge of precautions;Pain   OT Treatment/Interventions: Self-care/ADL training;Therapeutic exercise;DME and/or AE instruction;Therapeutic activities;Patient/family education;Balance training    OT Goals(Current goals can be found in the care plan section) Acute Rehab OT Goals OT Goal Formulation: With patient Potential to Achieve Goals: Good  OT Frequency: Min 2X/week   Barriers to D/C:            Co-evaluation              End of Session Equipment Utilized During Treatment: Gait belt;Rolling walker;Back brace Nurse Communication: Mobility status;Precautions  Activity Tolerance: Patient tolerated treatment well Patient left: in chair;with call bell/phone within reach   Time: 0815-0838 OT Time Calculation (min): 23 min Charges:  OT General Charges $OT Visit: 1 Procedure OT Evaluation $OT Eval Moderate Complexity: 1 Procedure G-Codes:    Austin Bolton, Austin Bolton 11/18/2015, 9:03 AM  Austin Bolton, Austin Bolton   OTR/L Pager: [phone]: [phone] Office: [phone] .

## 2015-11-18 NOTE — Evaluation (Signed)
Physical Therapy Evaluation Patient Details Name: Austin Bolton MRN: [id number] DOB: 13-Feb-1961 Today's Date: 11/18/2015   History of Present Illness  55 yo male s/p ALIF L 5-s1 PMH: kidney stones, DDD, HA, depression, anemia, R knee surg, June 2016 back surg  Clinical Impression  Pt admitted with above diagnosis. Pt currently with functional limitations due to the deficits listed below (see PT Problem List). At the time of PT eval pt was able to perform transfers and ambulation with min guard for safety. Pt overall moving very slow and guarded. Pt plans on staying on second floor of home until he begins outpatient PT. Feel HHPT would be a good option to assist in advancing safe mobility after surgery. Pt will benefit from skilled PT to increase their independence and safety with mobility to allow discharge to the venue listed below.       Follow Up Recommendations Home health PT;Supervision for mobility/OOB    Equipment Recommendations  None recommended by PT    Recommendations for Other Services       Precautions / Restrictions Precautions Precautions: Back Precaution Comments: handout provided and reviewed for back precautions Required Braces or Orthoses: Spinal Brace Spinal Brace: Lumbar corset;Applied in sitting position Restrictions Weight Bearing Restrictions: No      Mobility  Bed Mobility Overal bed mobility: Modified Independent             General bed mobility comments: Pt demonstrated proper log roll technique with HOB flat but railing up for support.   Transfers Overall transfer level: Needs assistance Equipment used: Rolling walker (2 wheeled) Transfers: Sit to/from Stand Sit to Stand: Min guard         General transfer comment: pulling on RW and needs incr time to power up  Ambulation/Gait Ambulation/Gait assistance: Min guard Ambulation Distance (Feet): 75 Feet Assistive device: Rolling walker (2 wheeled) Gait Pattern/deviations: Step-through  pattern;Decreased stride length;Trunk flexed Gait velocity: Decreased Gait velocity interpretation: Below normal speed for age/gender General Gait Details: Very slow and guarded. Pt with increased UE support on the RW due to pain and fear of pain.   Stairs            Wheelchair Mobility    Modified Rankin (Stroke Patients Only)       Balance Overall balance assessment: Needs assistance Sitting-balance support: Feet supported;No upper extremity supported Sitting balance-Leahy Scale: Good     Standing balance support: No upper extremity supported;During functional activity Standing balance-Leahy Scale: Poor Standing balance comment: Pt lost balance when he let go of the walker to attempt putting on his glasses                             Pertinent Vitals/Pain Pain Assessment: Faces Faces Pain Scale: Hurts whole lot Pain Location: Incision site Pain Descriptors / Indicators: Operative site guarding;Aching Pain Intervention(s): Limited activity within patient's tolerance;Monitored during session;Repositioned    Home Living Family/patient expects to be discharged to:: Private residence Living Arrangements: Spouse/significant other Available Help at Discharge: Family Type of Home: House Home Access: Stairs to enter Entrance Stairs-Rails: None Entrance Stairs-Number of Steps: 2 Home Layout: Two level Home Equipment: Environmental consultant - 2 wheels      Prior Function Level of Independence: Independent         Comments: pt reports after last surgery stayed in room for 3 weeks on second floor with wife (A)     Hand Dominance   Dominant Hand: Right  Extremity/Trunk Assessment   Upper Extremity Assessment: Defer to OT evaluation           Lower Extremity Assessment: Generalized weakness      Cervical / Trunk Assessment: Other exceptions (x2 surg)  Communication   Communication: No difficulties  Cognition Arousal/Alertness: Awake/alert Behavior  During Therapy: WFL for tasks assessed/performed Overall Cognitive Status: Within Functional Limits for tasks assessed                      General Comments      Exercises        Assessment/Plan    PT Assessment Patient needs continued PT services  PT Diagnosis Difficulty walking;Acute pain   PT Problem List Decreased strength;Decreased range of motion;Decreased activity tolerance;Decreased balance;Decreased mobility;Decreased knowledge of use of DME;Decreased safety awareness;Decreased knowledge of precautions;Pain  PT Treatment Interventions DME instruction;Gait training;Stair training;Functional mobility training;Therapeutic activities;Therapeutic exercise;Neuromuscular re-education;Patient/family education   PT Goals (Current goals can be found in the Care Plan section) Acute Rehab PT Goals Patient Stated Goal: Improve function PT Goal Formulation: With patient Time For Goal Achievement: 11/25/15 Potential to Achieve Goals: Good    Frequency Min 5X/week   Barriers to discharge        Co-evaluation               End of Session Equipment Utilized During Treatment: Back brace Activity Tolerance: Patient limited by pain Patient left: in bed;with call bell/phone within reach Nurse Communication: Mobility status         Time: [id number] PT Time Calculation (min) (ACUTE ONLY): 19 min   Charges:   PT Evaluation $PT Eval Moderate Complexity: 1 Procedure     PT G Codes:        Austin Bolton, Austin Bolton 11/18/2015, 1:00 PM   Austin SlipperLaura Layia Walla, PT, DPT Acute Rehabilitation Services Pager: [phone]

## 2015-11-19 NOTE — Progress Notes (Signed)
Physical Therapy Treatment Patient Details Name: Austin Bolton MRN: [id number] DOB: 21-Sep-1961 Today's Date: 11/19/2015    History of Present Illness 55 yo male s/p ALIF L 5-s1 PMH: kidney stones, DDD, HA, depression, anemia, R knee surg, June 2016 back surg    PT Comments    Pt progressing slowly towards physical therapy goals. Pt has a difficult time finding a comfortable position in sitting. Pillows arranged behind him for max support however pt continues to appear restless and uncomfortable. Pt still with increased UE support on RW due to fear of pain in back. Unwilling to progress ambulation distance. Will continue to follow.   Follow Up Recommendations  Home health PT;Supervision for mobility/OOB     Equipment Recommendations  None recommended by PT    Recommendations for Other Services       Precautions / Restrictions Precautions Precautions: Back Precaution Booklet Issued: Yes (comment) Precaution Comments: Back precautions were reviewed throughout functional mobility.  Required Braces or Orthoses: Spinal Brace Spinal Brace: Lumbar corset;Applied in sitting position Restrictions Weight Bearing Restrictions: No    Mobility  Bed Mobility Overal bed mobility: Modified Independent             General bed mobility comments: Pt demonstrated proper log roll technique with HOB flat but railing up for support.   Transfers Overall transfer level: Needs assistance Equipment used: Rolling walker (2 wheeled) Transfers: Sit to/from Stand Sit to Stand: Min guard         General transfer comment: Bed raised for comfort due to pt's height. pulling on RW and needs incr time to power up  Ambulation/Gait Ambulation/Gait assistance: Min guard Ambulation Distance (Feet): 75 Feet Assistive device: Rolling walker (2 wheeled) Gait Pattern/deviations: Step-through pattern;Decreased stride length;Trunk flexed Gait velocity: Decreased Gait velocity interpretation: Below normal  speed for age/gender General Gait Details: Very slow and guarded. Pt with increased UE support on the RW due to pain and fear of pain.    Stairs            Wheelchair Mobility    Modified Rankin (Stroke Patients Only)       Balance Overall balance assessment: Needs assistance Sitting-balance support: Feet supported;No upper extremity supported Sitting balance-Leahy Scale: Good     Standing balance support: No upper extremity supported;During functional activity Standing balance-Leahy Scale: Poor                      Cognition Arousal/Alertness: Awake/alert Behavior During Therapy: WFL for tasks assessed/performed Overall Cognitive Status: Within Functional Limits for tasks assessed                      Exercises      General Comments        Pertinent Vitals/Pain Pain Assessment: Faces Faces Pain Scale: Hurts whole lot Pain Location: Back and abdominal incision site Pain Descriptors / Indicators: Operative site guarding;Aching;Discomfort Pain Intervention(s): Limited activity within patient's tolerance;Monitored during session;Repositioned    Home Living                      Prior Function            PT Goals (current goals can now be found in the care plan section) Acute Rehab PT Goals Patient Stated Goal: Improve function PT Goal Formulation: With patient Time For Goal Achievement: 11/25/15 Potential to Achieve Goals: Good Progress towards PT goals: Progressing toward goals    Frequency  Min 5X/week  PT Plan Current plan remains appropriate    Co-evaluation             End of Session Equipment Utilized During Treatment: Back brace Activity Tolerance: Patient limited by pain Patient left: in bed;with call bell/phone within reach     Time: 0816-0830 PT Time Calculation (min) (ACUTE ONLY): 14 min  Charges:  $Gait Training: 8-22 mins                    G Codes:      Conni SlipperKirkman, Atara Paterson 11/19/2015, 9:04 AM    Conni SlipperLaura Tamar Miano, PT, DPT Acute Rehabilitation Services Pager: [phone]

## 2015-11-19 NOTE — Progress Notes (Signed)
Patient ID: Austin Bolton, male   DOB: 1961/04/06, 55 y.o.   MRN: [id number] Still having a lot of lumbar pain and abdominal discomfort. Some flatus . No leg weakness. To stay for at least 24-48 hours

## 2015-11-20 MED ORDER — MANAGING BACK PAIN BOOK
Freq: Once | Status: AC
  Administered 2015-11-20: 08:00:00
  Filled 2015-11-20: qty 1

## 2015-11-20 NOTE — Progress Notes (Signed)
Patient ID: Austin Bolton, male   DOB: 01-16-1961, 55 y.o.   MRN: [id number] Ambulating, wound dry. Still lumbar pain and constipation. Flatus positive. Declined fleet enema

## 2015-11-20 NOTE — Progress Notes (Signed)
PT Cancellation Note  Patient Details Name: Austin JubaRandy Melle MRN: [id number] DOB: [birth year]   Cancelled Treatment:     Pt politely deferring PT session at this time due to pain & not feeling well.  States he has been up walking with [facility] staff and he will do it again when he feels better.  Made RN aware.  Will attempt back later today if time allows.      Verdell FaceKelly Petra Sargeant, [place]PT[place] [phone] 11/20/2015

## 2015-11-21 ENCOUNTER — Inpatient Hospital Stay (HOSPITAL_COMMUNITY): Payer: BLUE CROSS/BLUE SHIELD

## 2015-11-21 MED ORDER — HYDROMORPHONE HCL 2 MG PO TABS
4.0000 mg | ORAL_TABLET | ORAL | Status: DC | PRN
  Administered 2015-11-21: 2 mg via ORAL
  Filled 2015-11-21: qty 2

## 2015-11-21 MED ORDER — ONDANSETRON HCL 4 MG PO TABS
4.0000 mg | ORAL_TABLET | Freq: Three times a day (TID) | ORAL | Status: DC | PRN
  Administered 2015-11-21: 4 mg via ORAL
  Filled 2015-11-21: qty 1

## 2015-11-21 MED ORDER — POLYETHYLENE GLYCOL 3350 17 G PO PACK
17.0000 g | PACK | Freq: Every day | ORAL | Status: DC
Start: 2015-11-21 — End: 2015-11-22
  Administered 2015-11-21 – 2015-11-22 (×2): 17 g via ORAL
  Filled 2015-11-21 (×2): qty 1

## 2015-11-21 MED ORDER — OXYCODONE HCL ER 10 MG PO T12A
20.0000 mg | EXTENDED_RELEASE_TABLET | Freq: Two times a day (BID) | ORAL | Status: DC
  Administered 2015-11-21: 20 mg via ORAL
  Filled 2015-11-21: qty 2

## 2015-11-21 MED ORDER — OXYCODONE-ACETAMINOPHEN 5-325 MG PO TABS
1.0000 | ORAL_TABLET | ORAL | Status: DC | PRN
  Administered 2015-11-21: 1 via ORAL
  Administered 2015-11-22: 2 via ORAL
  Administered 2015-11-22: 1 via ORAL
  Administered 2015-11-22 (×2): 2 via ORAL
  Filled 2015-11-21 (×3): qty 1
  Filled 2015-11-21: qty 2
  Filled 2015-11-21: qty 1
  Filled 2015-11-21: qty 2

## 2015-11-21 NOTE — Progress Notes (Signed)
Patient c/o nausea after dilaudid given prn for pain; requesting to go back on percocet; Dr.Elsner returned page to add percocet back to prn medication options; continue to monitor patient for queasiness.

## 2015-11-21 NOTE — Progress Notes (Signed)
Physical Therapy Treatment Patient Details Name: Austin JubaRandy Bolton MRN: [id number] DOB: Dec 06, 1960 Today's Date: 11/21/2015    History of Present Illness 55 y.o. male s/p ALIF L 5-s1 PMH: kidney stones, DDD, HA, depression, anemia, R knee surg, June 2016 back surg    PT Comments    Making slow progress, but willing to get up with some encouragement; Seems limited by the anticipation of pain, carefully guarded with every move; I'm worried that he will minimize any moving or mobility once home.  I may be worth considering adding a flight of steps goal (perhaps sideways with one rail), to faciliatate independence and encourage getting up and around more at home.   Follow Up Recommendations  Home health PT;Supervision for mobility/OOB     Equipment Recommendations  Other (comment) (perhaps shower chair?will defer to OT)    Recommendations for Other Services       Precautions / Restrictions Precautions Precautions: Back Precaution Booklet Issued: No Precaution Comments: reviewed back precautions Required Braces or Orthoses: Spinal Brace Spinal Brace: Lumbar corset;Applied in sitting position Restrictions Weight Bearing Restrictions: No    Mobility  Bed Mobility Overal bed mobility: Needs Assistance Bed Mobility: Rolling;Sidelying to Sit Rolling: Supervision Sidelying to sit: Supervision     Sit to sidelying: Supervision General bed mobility comments: cues for technique.   Transfers Overall transfer level: Needs assistance Equipment used: Rolling walker (2 wheeled) Transfers: Sit to/from Stand Sit to Stand: Min guard         General transfer comment: Bed raised for comfort due to pt's height. pulling on RW and needs incr time to power up  Ambulation/Gait Ambulation/Gait assistance: Min guard Ambulation Distance (Feet): 70 Feet Assistive device: Rolling walker (2 wheeled) Gait Pattern/deviations: Step-through pattern;Decreased step length - right;Decreased step length -  left;Decreased stride length Gait velocity: Decreased   General Gait Details: Very slow and guarded. Pt with increased UE support on the RW due to pain and fear of pain. Sized the RW up for optimal fit   Stairs Stairs:  (We discussed techniques, he got up the steps the last time with one arm around wife and one rail on L)          Wheelchair Mobility    Modified Rankin (Stroke Patients Only)       Balance     Sitting balance-Leahy Scale: Good       Standing balance-Leahy Scale: Poor                      Cognition Arousal/Alertness: Awake/alert Behavior During Therapy: WFL for tasks assessed/performed Overall Cognitive Status: Within Functional Limits for tasks assessed                      Exercises      General Comments        Pertinent Vitals/Pain Pain Assessment: 0-10 Pain Score: 9  Pain Location: back and stomach Pain Descriptors / Indicators: Aching;Discomfort;Guarding Pain Intervention(s): Monitored during session;Premedicated before session;Repositioned    Home Living                      Prior Function            PT Goals (current goals can now be found in the care plan section) Acute Rehab PT Goals Patient Stated Goal: Improve function PT Goal Formulation: With patient Time For Goal Achievement: 11/25/15 Potential to Achieve Goals: Good Progress towards PT goals: Progressing toward goals (very slowly  and limted by the anticipation of pain)    Frequency  Min 5X/week    PT Plan Current plan remains appropriate    Co-evaluation             End of Session Equipment Utilized During Treatment: Back brace Activity Tolerance: Patient limited by pain Patient left: in chair;with call bell/phone within reach;Other (comment) (readying for lunch)     Time: [phone] (end time is approximate) PT Time Calculation (min) (ACUTE ONLY): 20 min  Charges:  $Gait Training: 8-22 mins                    G Codes:       Austin Bolton 11/21/2015, 12:55 PM  Austin Bolton, Austin Bolton  Acute Rehabilitation Services Pager [phone] Office [phone]

## 2015-11-21 NOTE — Progress Notes (Addendum)
Occupational Therapy Treatment Patient Details Name: Austin Bolton MRN: [id number] DOB: 01/01/1961 Today's Date: 11/21/2015    History of present illness 55 y.o. male s/p ALIF L 5-s1 PMH: kidney stones, DDD, HA, depression, anemia, R knee surg, June 2016 back surgery   OT comments  Pt progressing. Education provided in session. Pt in pain during session.  Follow Up Recommendations  No OT follow up    Equipment Recommendations  None recommended by OT    Recommendations for Other Services      Precautions / Restrictions Precautions Precautions: Back Precaution Booklet Issued: No Precaution Comments: reviewed back precautions Required Braces or Orthoses: Spinal Brace Spinal Brace: Lumbar corset;Applied in sitting position Restrictions Weight Bearing Restrictions: No       Mobility Bed Mobility Overal bed mobility: Needs Assistance Bed Mobility: Rolling;Sidelying to Sit;Sit to Sidelying Rolling: Supervision Sidelying to sit: Supervision     Sit to sidelying: Supervision General bed mobility comments: cues for technique.   Transfers Overall transfer level: Needs assistance Equipment used: Rolling walker (2 wheeled) Transfers: Sit to/from Stand Sit to Stand: Min guard              Balance    Used RW for ambulation-appeared to be putting a lot of weight through UEs.                               ADL Overall ADL's : Needs assistance/impaired                 Upper Body Dressing : Set up;Supervision/safety;Sitting       Toilet Transfer: Min guard;Ambulation;RW (sit to stand from bed)           Functional mobility during ADLs: Min guard;Rolling walker General ADL Comments: Discussed incorporating precautions into functional activities. Educated on safety such as safe shoewear, use of bag on walker, and recommended someone be with him for shower transfer, and not standing for LB bathing. Discussed 3 in 1 but pt does not seem interested in  one.  Educated on AE as pt unable to cross legs over knees.      Vision                     Perception     Praxis      Cognition  Awake/Alert Behavior During Therapy: WFL for tasks assessed/performed Overall Cognitive Status: Within Functional Limits for tasks assessed                       Extremity/Trunk Assessment               Exercises     Shoulder Instructions       General Comments      Pertinent Vitals/ Pain       Pain Assessment: 0-10 Pain Score:  (9-10) Pain Location: back and stomach Pain Intervention(s): Monitored during session;Limited activity within patient's tolerance;Repositioned;Other (comment) (notified nurse)  Home Living                                          Prior Functioning/Environment              Frequency Min 2X/week     Progress Toward Goals  OT Goals(current goals can now be found in the care plan section)  Progress  towards OT goals: Progressing toward goals-added two goals  Acute Rehab OT Goals OT Goal Formulation: With patient Potential to Achieve Goals: Good ADL Goals Pt Will Perform Grooming: with supervision;sitting Pt Will Perform Upper Body Bathing: with supervision;sitting Pt Will Perform Lower Body Bathing: with min assist;sit to/from stand Pt Will Perform Lower Body Dressing: with set-up;with supervision;with caregiver independent in assisting;sit to/from stand;with adaptive equipment Pt Will Transfer to Toilet: with supervision;ambulating;bedside commode Pt Will Perform Tub/Shower Transfer: Tub transfer;with min assist;ambulating;rolling walker Additional ADL Goal #1: Pt will don doff brace mod I as precursor to adls  Plan Discharge plan remains appropriate    Co-evaluation                 End of Session Equipment Utilized During Treatment: Rolling walker;Gait belt;Back brace   Activity Tolerance Patient limited by pain   Patient Left in bed;with call  bell/phone within reach   Nurse Communication Mobility status;Other (comment) (pain )        Time: [id number] OT Time Calculation (min): 14 min  Charges: OT General Charges $OT Visit: 1 Procedure OT Treatments $Self Care/Home Management : 8-22 mins  Earlie RavelingStraub, Tenille Morrill L OTR/L [phone] 11/21/2015, 10:03 AM

## 2015-11-21 NOTE — Progress Notes (Addendum)
Reports nausea and vomiting; patient states,"I'm queasy" from the oxycontin. Patient denies allergy but states pain medications have always made him nauseated.  Call placed to Dr.Jones office to review and change pain medication.  Orders received. Will monitor patient for relief of pain and nausea.

## 2015-11-21 NOTE — Care Management Note (Signed)
Case Management Note  Patient Details  Name: Viola Placeres MRN: [id number] Date of Birth: Dec 27, 1960  Subjective/Objective:                    Action/Plan: Plan is for patient to discharge home with home health services. CM met with the patient and provided him a list of home health agencies in the [facility] area. Patient asked to go over them with his wife and have CM come back in the am. CM will follow up Tuesday am.   Expected Discharge Date:                  Expected Discharge Plan:  [place]  In-House Referral:     Discharge planning Services  CM Consult  Post Acute Care Choice:    Choice offered to:     DME Arranged:    DME Agency:     HH Arranged:  PT HH Agency:     Status of Service:  In process, will continue to follow  Medicare Important Message Given:    Date Medicare IM Given:    Medicare IM give by:    Date Additional Medicare IM Given:    Additional Medicare Important Message give by:     If discussed at [place] of Stay Meetings, dates discussed:    Additional Comments:  Pollie Friar, RN 11/21/2015, 3:58 PM

## 2015-11-21 NOTE — Progress Notes (Signed)
Patient ID: Austin Bolton, male   DOB: 01-20-1961, 55 y.o.   MRN: [id number] Still having considerable back pain. He is mobilizing into the hallway. Still requiring IV pain medications. No leg pain or numbness tingling or weakness. Good strength. Positive flatus.

## 2015-11-22 MED ORDER — METHOCARBAMOL 500 MG PO TABS: 500.0000 mg | ORAL_TABLET | Freq: Four times a day (QID) | ORAL | Status: DC | PRN

## 2015-11-22 MED ORDER — OXYCODONE-ACETAMINOPHEN 5-325 MG PO TABS: 1.0000 | ORAL_TABLET | ORAL | Status: DC | PRN

## 2015-11-22 NOTE — Discharge Instructions (Signed)
Spinal Fusion, Care After °Refer to this sheet in the next few weeks. These instructions provide you with information on caring for yourself after your procedure. Your caregiver may also give you more specific instructions. Your treatment has been planned according to current medical practices, but problems sometimes occur. Call your caregiver if you have any problems or questions after your procedure. °HOME CARE INSTRUCTIONS  °· Take whatever pain medicine has been prescribed by your caregiver. Do not take over-the-counter pain medicine unless directed otherwise by your caregiver. °· Do not drive if you are taking narcotic pain medicines. °· Change your bandage (dressing) if necessary or as directed by your caregiver. °· Do not get your surgical cut (incision) wet. After a few days you may take quick showers (rather than baths), but keep your incision clean and dry. Covering the incision with plastic wrap while you shower should keep your incision dry. A few weeks after surgery, once your incision has healed and your caregiver says it is okay, you can take baths or go swimming. °· If you have been prescribed medicine to prevent your blood from clotting, follow the directions carefully. °· Check the area around your incision often. Look for redness and swelling. Also, look for anything leaking from your wound. You can use a mirror or have a family member inspect your incision if it is in a place where it is difficult for you to see. °· Ask your caregiver what activities you should avoid and for how long. °· Walk as much as possible. °· Do not lift anything heavier than 10 pounds (4.5 kilograms) until your caregiver says it is safe. °· Do not twist or bend for a few weeks. Try not to pull on things. Avoid sitting for long periods of time. Change positions at least every hour. °· Ask your caregiver what kinds of exercise you should do to make your back stronger and when you should begin doing these exercises. °SEEK  IMMEDIATE MEDICAL CARE IF:  °· Pain suddenly becomes much worse. °· The incision area is red, swollen, bleeding, or leaking fluid. °· Your legs or feet become increasingly painful, numb, weak, or swollen. °· You have trouble controlling urination or bowel movements. °· You have trouble breathing. °· You have chest pain. °· You have a fever. °MAKE SURE YOU: °· Understand these instructions. °· Will watch your condition. °· Will get help right away if you are not doing well or get worse. °  °This information is not intended to replace advice given to you by your health care provider. Make sure you discuss any questions you have with your health care provider. °  °Document Released: 05/11/2005 Document Revised: 11/12/2014 Document Reviewed: 04/06/2015 °Elsevier Interactive Patient Education ©2016 [employer] ° °

## 2015-11-22 NOTE — Care Management Note (Signed)
Case Management Note  Patient Details  Name: Austin Bolton MRN: [id number] Date of Birth: 05-17-1961  Subjective/Objective:                    Action/Plan: Patient is discharging home today with home health services. CM met with the patient this am and he selected Austin Bolton. Katie with [facility] notified and accepted the referral. Patient stated he already has equipment at home from prior surgery. Will update bedside RN.   Expected Discharge Date:                  Expected Discharge Plan:  [place]  In-House Referral:     Discharge planning Services  CM Consult  Post Acute Care Choice:  Home Health Choice offered to:  Patient, Spouse  DME Arranged:    DME Agency:     HH Arranged:  PT [place]:  [facility]  Status of Service:  Completed, signed off  Medicare Important Message Given:    Date Medicare IM Given:    Medicare IM give by:    Date Additional Medicare IM Given:    Additional Medicare Important Message give by:     If discussed at [place] of Stay Meetings, dates discussed:    Additional Comments:  Pollie Friar, RN 11/22/2015, 11:01 AM

## 2015-11-22 NOTE — Progress Notes (Signed)
Pt d/c to home by car with family. Assessment stable. Prescriptions given. All questions answered. 

## 2015-11-22 NOTE — Discharge Summary (Signed)
Physician Discharge Summary  Patient ID: Austin Bolton MRN: [id number] DOB/AGE: October 06, 1961 55 y.o.  Admit date: 11/17/2015 Discharge date: 11/22/2015  Admission Diagnoses: DDD/ failed back syndrome    Discharge Diagnoses: same   Discharged Condition: good  Hospital Course: The patient was admitted on 11/17/2015 and taken to the operating room where the patient underwent ALIF. The patient tolerated the procedure well and was taken to the recovery room and then to the floor in stable condition. The hospital course was routine. There were no complications. The wound remained clean dry and intact. Pt had appropriate back soreness. No complaints of leg pain or new N/T/W. The patient remained afebrile with stable vital signs, and tolerated a regular diet. The patient continued to increase activities, and pain was well controlled with oral pain medications.   Consults: None  Significant Diagnostic Studies:  Results for orders placed or performed during the hospital encounter of 11/08/15  Surgical pcr screen  Result Value Ref Range   MRSA, PCR NEGATIVE NEGATIVE   Staphylococcus aureus POSITIVE (A) NEGATIVE  CBC WITH DIFFERENTIAL  Result Value Ref Range   WBC 4.0 4.0 - 10.5 K/uL   RBC 4.59 4.22 - 5.81 MIL/uL   Hemoglobin 14.1 13.0 - 17.0 g/dL   HCT 74.1 28.7 - 86.7 %   MCV 88.5 78.0 - 100.0 fL   MCH 30.7 26.0 - 34.0 pg   MCHC 34.7 30.0 - 36.0 g/dL   RDW 67.2 09.4 - 70.9 %   Platelets 180 150 - 400 K/uL   Neutrophils Relative % 72 %   Neutro Abs 2.9 1.7 - 7.7 K/uL   Lymphocytes Relative 13 %   Lymphs Abs 0.5 (L) 0.7 - 4.0 K/uL   Monocytes Relative 10 %   Monocytes Absolute 0.4 0.1 - 1.0 K/uL   Eosinophils Relative 4 %   Eosinophils Absolute 0.2 0.0 - 0.7 K/uL   Basophils Relative 1 %   Basophils Absolute 0.0 0.0 - 0.1 K/uL  Protime-INR  Result Value Ref Range   Prothrombin Time 14.2 11.6 - 15.2 seconds   INR 1.08 0.00 - 1.49  Comprehensive metabolic panel  Result Value Ref Range    Sodium 140 135 - 145 mmol/L   Potassium 4.8 3.5 - 5.1 mmol/L   Chloride 106 101 - 111 mmol/L   CO2 28 22 - 32 mmol/L   Glucose, Bld 109 (H) 65 - 99 mg/dL   BUN 8 6 - 20 mg/dL   Creatinine, Ser 6.28 0.61 - 1.24 mg/dL   Calcium 9.5 8.9 - 36.6 mg/dL   Total Protein 6.6 6.5 - 8.1 g/dL   Albumin 4.2 3.5 - 5.0 g/dL   AST 16 15 - 41 U/L   ALT 14 (L) 17 - 63 U/L   Alkaline Phosphatase 45 38 - 126 U/L   Total Bilirubin 1.3 (H) 0.3 - 1.2 mg/dL   GFR calc non Af Amer >60 >60 mL/min   GFR calc Af Amer >60 >60 mL/min   Anion gap 6 5 - 15  Type and screen [facility]  Result Value Ref Range   ABO/RH(D) A POS    Antibody Screen NEG    Sample Expiration 11/22/2015    Extend sample reason NO TRANSFUSIONS OR PREGNANCY IN THE PAST 3 MONTHS   ABO/Rh  Result Value Ref Range   ABO/RH(D) A POS     Dg Chest 1 View  11/17/2015  CLINICAL DATA:  Preoperative chest x-ray . EXAM: CHEST 1 VIEW COMPARISON:  11/08/2015. FINDINGS: Mediastinum and hilar structures normal. Lungs are clear. Previously identified nodular opacities over the lung bases represent nipple shadows as evidenced by nipple markers. Heart size normal. No pleural effusion or pneumothorax. Degenerative changes thoracic spine and both shoulders. IMPRESSION: No acute cardiopulmonary disease. Previously identified nodular densities of the lung bases represent nipple shadows. Electronically Signed   By: Maisie Fus  Register   On: 11/17/2015 08:12   Chest 2 View  11/08/2015  CLINICAL DATA:  Preop for lumbar fusion. EXAM: CHEST  2 VIEW COMPARISON:  None. FINDINGS: The heart size and mediastinal contours are within normal limits. Probable nipple shadows seen over the left lower lobe. No pneumothorax or pleural effusion is noted. No definite acute pulmonary disease is noted. The visualized skeletal structures are unremarkable. IMPRESSION: Rounded density seen over left lower lobe most consistent with overlying nipple shadow; repeat radiograph  nipple markers is recommended to rule out pulmonary nodule. No other abnormality seen in the chest. Electronically Signed   By: Lupita Raider, M.D.   On: 11/08/2015 12:10   Dg Lumbar Spine 2-3 Views  11/21/2015  CLINICAL DATA:  Post lumbar fusion EXAM: LUMBAR SPINE - 2-3 VIEW COMPARISON:  11/17/2015 FINDINGS: Changes of anterior fusion noted at L5-S1. No hardware or bony complicating features. Early anterior degenerative spurring throughout the lumbar spine. IMPRESSION: Changes of anterior fusion at L5-S1.  No complicating feature. Electronically Signed   By: Charlett Nose M.D.   On: 11/21/2015 10:58   Dg Lumbar Spine 2-3 Views  11/17/2015  CLINICAL DATA:  L5-S1 anterior interbody fusion EXAM: DG C-ARM 61-120 MIN; LUMBAR SPINE - 2-3 VIEW COMPARISON:  Postoperative portable supine image of today's date FINDINGS: Two intraoperative fluoro spot images are submitted. The patient has undergone placement of the anterior and intradiscal fusion device2 at L5-S1. Radiographic positioning of the prosthetic components is good. The reported fluoro time is 36 seconds. IMPRESSION: There is no immediate postprocedure complication on these 2 fluoro spot images. Electronically Signed   By: David  [place] M.D.   On: 11/17/2015 10:37   Dg C-arm 1-60 Min  11/17/2015  CLINICAL DATA:  L5-S1 anterior interbody fusion EXAM: DG C-ARM 61-120 MIN; LUMBAR SPINE - 2-3 VIEW COMPARISON:  Postoperative portable supine image of today's date FINDINGS: Two intraoperative fluoro spot images are submitted. The patient has undergone placement of the anterior and intradiscal fusion device2 at L5-S1. Radiographic positioning of the prosthetic components is good. The reported fluoro time is 36 seconds. IMPRESSION: There is no immediate postprocedure complication on these 2 fluoro spot images. Electronically Signed   By: David  [place] M.D.   On: 11/17/2015 10:37   Dg Or Local Abdomen  11/17/2015  CLINICAL DATA:  Lumbar fusion.  Incorrect  instrument count. EXAM: OR LOCAL ABDOMEN COMPARISON:  CT 10/04/2015. FINDINGS: 1001 hours. Single AP view demonstrates evidence of anterior fusion at L5-S1. No retained instruments are identified. No acute osseous findings are seen. There are bilateral pelvic calcifications consistent with phleboliths. IMPRESSION: No evidence of retained surgical instrument. These results were called by telephone at the time of interpretation on 11/17/2015 at 10:20 am to the patient's operating room. Electronically Signed   By: Carey Bullocks M.D.   On: 11/17/2015 10:22    Antibiotics:  Anti-infectives    Start     Dose/Rate Route Frequency Ordered Stop   11/17/15 1400  ceFAZolin (ANCEF) IVPB 1 g/50 mL premix     1 g 100 mL/hr over 30 Minutes Intravenous Every 8 hours 11/17/15  1351 11/17/15 2216   11/17/15 0730  ceFAZolin (ANCEF) IVPB 2 g/50 mL premix     2 g 100 mL/hr over 30 Minutes Intravenous To [facility] 11/17/15 0727 11/17/15 0831   11/17/15 0730  bacitracin 50,000 Units in sodium chloride irrigation 0.9 % 500 mL irrigation  Status:  Discontinued       As needed 11/17/15 0943 11/17/15 1035      Discharge Exam: Blood pressure 138/89, pulse 77, temperature 98.1 F (36.7 C), temperature source Oral, resp. rate 20, height  (1.88 m), weight 89.812 kg (198 lb), SpO2 100 %. Neurologic: Grossly normal Incision CDI  Discharge Medications:     Medication List    TAKE these medications        ibuprofen 200 MG tablet  Commonly known as:  ADVIL,MOTRIN  Take 4 tablets (800 mg total) by mouth every 6 (six) hours as needed for headache or moderate pain. May resume 4 days post-op     methocarbamol 500 MG tablet  Commonly known as:  ROBAXIN  Take 1 tablet (500 mg total) by mouth every 6 (six) hours as needed for muscle spasms.     mupirocin ointment 2 %  Commonly known as:  BACTROBAN  2 (two) times daily. Nose     oxyCODONE-acetaminophen 5-325 MG tablet  Commonly known as:   PERCOCET/ROXICET  Take 1-2 tablets by mouth every 4 (four) hours as needed for moderate pain.        Disposition: home   Final Dx: ALIF L5-S1      Discharge Instructions    Call MD for:  difficulty breathing, headache or visual disturbances    Complete by:  As directed      Call MD for:  persistant nausea and vomiting    Complete by:  As directed      Call MD for:  redness, tenderness, or signs of infection (pain, swelling, redness, odor or green/yellow discharge around incision site)    Complete by:  As directed      Call MD for:  severe uncontrolled pain    Complete by:  As directed      Call MD for:  temperature >100.4    Complete by:  As directed      Diet - low sodium heart healthy    Complete by:  As directed      Discharge instructions    Complete by:  As directed   No driving, may shower, no heavy lifting, no bending     Increase activity slowly    Complete by:  As directed            Follow-up Information    Follow up with JONES,DAVID S, MD. Schedule an appointment as soon as possible for a visit in 2 weeks.   Specialty:  Neurosurgery   Contact information:   [address] [place] [address] [phone]        Signed: Tia Alert 11/22/2015, 7:18 AM

## 2015-11-22 NOTE — Progress Notes (Signed)
Physical Therapy Treatment Patient Details Name: Hulet Ehrmann MRN: [id number] DOB: August 18, 1961 Today's Date: 11/22/2015    History of Present Illness 55 y.o. male s/p ALIF L 5-s1 PMH: kidney stones, DDD, HA, depression, anemia, R knee surg, June 2016 back surg    PT Comments    Patient has completed stair training and preparing to d/c home. Wife was present and educated.  Follow Up Recommendations  Home health PT;Supervision for mobility/OOB     Equipment Recommendations  None recommended by PT    Recommendations for Other Services       Precautions / Restrictions Precautions Precautions: Back Precaution Comments: reviewed back precautions Required Braces or Orthoses: Spinal Brace Spinal Brace: Lumbar corset;Applied in sitting position Restrictions Weight Bearing Restrictions: No    Mobility  Bed Mobility Overal bed mobility: Needs Assistance;Modified Independent Bed Mobility: Sidelying to Sit   Sidelying to sit: Modified independent (Device/Increase time)          Transfers Overall transfer level: Needs assistance Equipment used: Rolling walker (2 wheeled) Transfers: Sit to/from Stand Sit to Stand: Min guard         General transfer comment: x2 with cues needed each time for safe use of RW  Ambulation/Gait Ambulation/Gait assistance: Min guard Ambulation Distance (Feet): 5 Feet (x2; to/from w/c) Assistive device: Rolling walker (2 wheeled)   Gait velocity: Decreased Gait velocity interpretation: Below normal speed for age/gender General Gait Details: very slow   Stairs Stairs: Yes Stairs assistance: Min guard Stair Management: One rail Left;No rails;Step to pattern;Forwards;Sideways Number of Stairs: 5 General stair comments: ascended 2 without rail and HHA (simulate outside steps); sideways with bil hands on rail x 3 (simulate inside steps to second floor)  Wheelchair Mobility    Modified Rankin (Stroke Patients Only)       Balance      Sitting balance-Leahy Scale: Good       Standing balance-Leahy Scale: Poor                      Cognition Arousal/Alertness: Awake/alert Behavior During Therapy: WFL for tasks assessed/performed Overall Cognitive Status: Within Functional Limits for tasks assessed       Memory: Decreased recall of precautions              Exercises      General Comments General comments (skin integrity, edema, etc.): Wife present and insisted that pt needed stair training prior to d/c (he refused x 2 days).      Pertinent Vitals/Pain Pain Assessment: 0-10 Pain Score: 6  Pain Location: stomach Pain Descriptors / Indicators: Operative site guarding Pain Intervention(s): Limited activity within patient's tolerance;Monitored during session;Premedicated before session;Repositioned    Home Living                      Prior Function            PT Goals (current goals can now be found in the care plan section) Acute Rehab PT Goals Patient Stated Goal: Improve function PT Goal Formulation: With patient Time For Goal Achievement: 11/25/15 Potential to Achieve Goals: Good Progress towards PT goals: Progressing toward goals    Frequency  Min 5X/week    PT Plan Current plan remains appropriate    Co-evaluation             End of Session Equipment Utilized During Treatment: Back brace;Gait belt Activity Tolerance: Patient limited by pain Patient left: in chair;with nursing/sitter in room;with family/visitor present (  preparing to d/c)     Time: 1610-9604 PT Time Calculation (min) (ACUTE ONLY): 19 min  Charges:  $Gait Training: 8-22 mins                    G Codes:      Taneika Choi 2015/11/26, 2:40 PM Pager [phone]

## 2015-11-22 NOTE — Progress Notes (Signed)
Occupational Therapy Treatment Patient Details Name: Austin Bolton MRN: [id number] DOB: [birth year] Today's Date: 11/22/2015    History of present illness 55 y.o. male s/p ALIF L 5-s1 PMH: kidney stones, DDD, HA, depression, anemia, R knee surg, June 2016 back surg   OT comments  Education provided in session. Pt may d/c home today.  Follow Up Recommendations  No OT follow up;Supervision - Intermittent    Equipment Recommendations  None recommended by OT    Recommendations for Other Services      Precautions / Restrictions Precautions Precautions: Back;Fall Precaution Booklet Issued: No Precaution Comments: reviewed back precautions Required Braces or Orthoses: Spinal Brace Spinal Brace: Lumbar corset;Applied in sitting position Restrictions Weight Bearing Restrictions: No       Mobility Bed Mobility Overal bed mobility: Needs Assistance Bed Mobility: Rolling;Sidelying to Sit;Sit to Sidelying Rolling: Supervision Sidelying to sit: Supervision     Sit to sidelying: Supervision General bed mobility comments: cues for technique  Transfers Overall transfer level: Needs assistance Equipment used: Rolling walker (2 wheeled) Transfers: Sit to/from Stand Sit to Stand: Supervision (and setup for RW)              Balance    OT assisted with simulated tub transfer (stepping over).                                ADL Overall ADL's : Needs assistance/impaired     Grooming: Oral care;Supervision/safety;Standing (items already at sink)           Upper Body Dressing : Set up;Supervision/safety;Sitting       Toilet Transfer: Min guard;Ambulation;RW (sit to stand from bed-setup and supervision)       Tub/ Shower Transfer: Tub transfer;Minimal assistance;Ambulation (used RW to ambulate close to simulated tub)   Functional mobility during ADLs: Rolling walker (Min assist for simulated tub transfer; min guard-ambulation) General ADL Comments: Discussed  incorporating precautions into functional activities. Reviewed information about back brace. Explained mobility was good for pt. Pt not interested in practicing with AE or dressing as wife will be there to assist him. Pt not interesting in getting a 3 in 1 or shower chair at hospital. Educated on tub transfer techniques. Recommended waiting at least a couple days before he steps over tub-unsure if he would clear his at home. Educated on safety.      Vision                     Perception     Praxis      Cognition  Awake/Alert Behavior During Therapy: WFL for tasks assessed/performed Overall Cognitive Status: Within Functional Limits for tasks assessed       Memory: Decreased recall of precautions;Decreased short-term memory               Extremity/Trunk Assessment               Exercises     Shoulder Instructions       General Comments      Pertinent Vitals/ Pain       Pain Assessment: 0-10 Pain Score: 6  Pain Location: stomach and back Pain Intervention(s): Monitored during session;Repositioned  Home Living  Prior Functioning/Environment              Frequency Min 2X/week     Progress Toward Goals  OT Goals(current goals can now be found in the care plan section)  Progress towards OT goals: Progressing toward goals  Acute Rehab OT Goals Patient Stated Goal: not stated OT Goal Formulation: With patient Potential to Achieve Goals: Good ADL Goals Pt Will Perform Grooming: with supervision;sitting Pt Will Perform Upper Body Bathing: with supervision;sitting Pt Will Perform Lower Body Bathing: with min assist;sit to/from stand Pt Will Perform Lower Body Dressing: with set-up;with supervision;with caregiver independent in assisting;sit to/from stand;with adaptive equipment Pt Will Transfer to Toilet: with supervision;ambulating;bedside commode Pt Will Perform Tub/Shower Transfer: Tub  transfer;with min assist;ambulating;rolling walker Additional ADL Goal #1: Pt will don doff brace mod I as precursor to adls  Plan Discharge plan needs to be updated    Co-evaluation                 End of Session Equipment Utilized During Treatment: Gait belt;Rolling walker;Back brace   Activity Tolerance Patient tolerated treatment well   Patient Left in bed;with call bell/phone within reach   Nurse Communication Other (comment) (pain; pt not wanting 3 in 1 or shower chair)        Time: 4782-9562 OT Time Calculation (min): 18 min  Charges: OT General Charges $OT Visit: 1 Procedure OT Treatments $Self Care/Home Management : 8-22 mins  Earlie Raveling OTR/L [phone] 11/22/2015, 10:02 AM

## 2015-11-23 ENCOUNTER — Encounter (HOSPITAL_COMMUNITY): Payer: Self-pay | Admitting: Neurological Surgery

## 2016-04-16 ENCOUNTER — Other Ambulatory Visit: Payer: Self-pay | Admitting: Neurological Surgery

## 2016-04-16 DIAGNOSIS — M47816 Spondylosis without myelopathy or radiculopathy, lumbar region: Secondary | ICD-10-CM

## 2016-04-25 ENCOUNTER — Other Ambulatory Visit: Payer: BLUE CROSS/BLUE SHIELD

## 2016-04-26 ENCOUNTER — Other Ambulatory Visit: Payer: Self-pay | Admitting: Neurological Surgery

## 2016-04-26 DIAGNOSIS — M5137 Other intervertebral disc degeneration, lumbosacral region: Secondary | ICD-10-CM

## 2016-05-02 ENCOUNTER — Ambulatory Visit
Admission: RE | Admit: 2016-05-02 | Discharge: 2016-05-02 | Disposition: A | Discharge status: Home / Self Care | Payer: BLUE CROSS/BLUE SHIELD | Source: Ambulatory Visit | Attending: Neurological Surgery | Admitting: Neurological Surgery

## 2016-05-02 DIAGNOSIS — M5137 Other intervertebral disc degeneration, lumbosacral region: Secondary | ICD-10-CM

## 2017-10-17 IMAGING — CT CT L SPINE W/O CM
4 of 7 series · 15 of 33 positions shown, 17 images · non-contrast
Comparison: MRI of the lumbar spine 09/02/2015.

CLINICAL DATA: Post-laminectomy syndrome.  Post discogram.

EXAM:
CT LUMBAR SPINE WITHOUT CONTRAST
TECHNIQUE: Multidetector CT imaging of the lumbar spine was performed without
intravenous contrast administration. Multiplanar CT image
reconstructions were also generated.

[Series 4: l spine bone · axial · 0.32mm/px · z∈[-64,+11]mm · 4 of 51 slices shown, 5 images]
[im 11/51  soft-tissue]
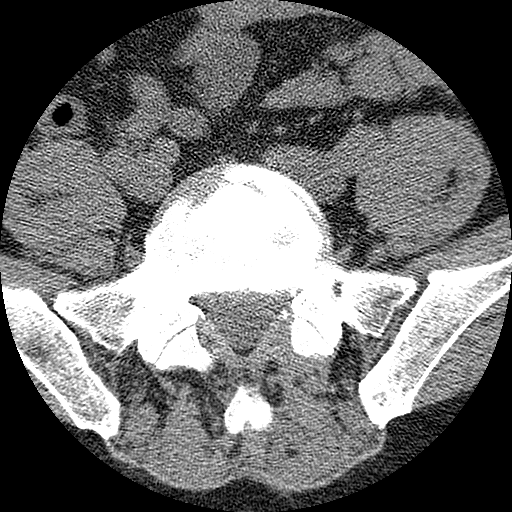
[im 11/51  bone]
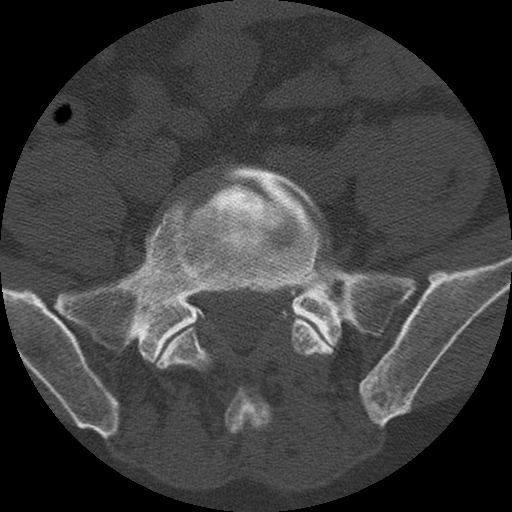
[im 21/51  bone]
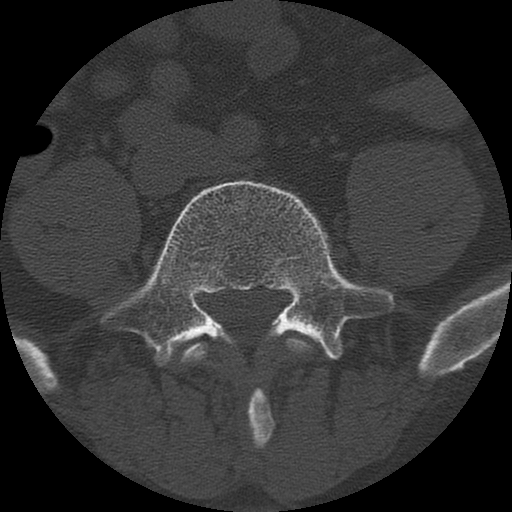
[im 31/51  bone]
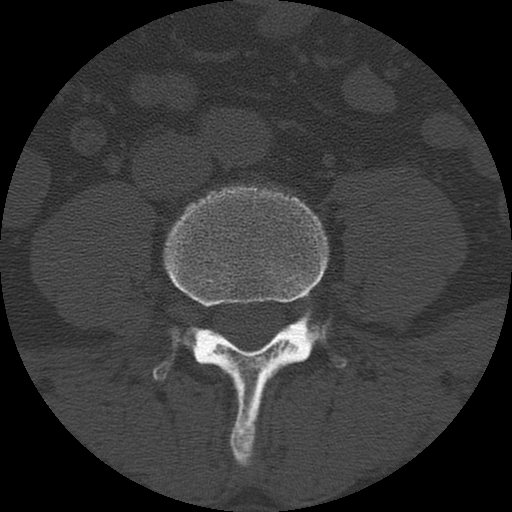
[im 41/51  bone]
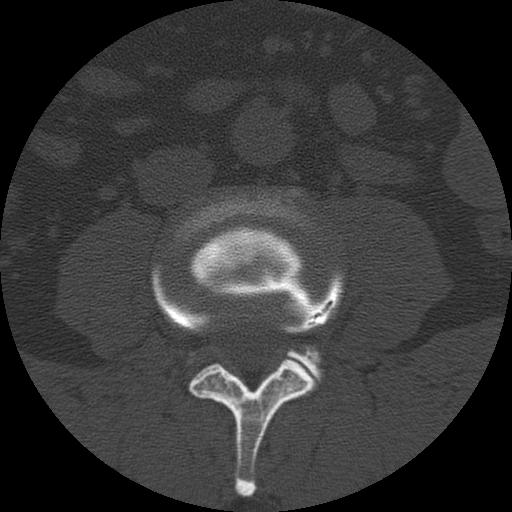

[Series 5: l spine detail · axial · 0.32mm/px · z∈[-59,+4]mm · 3 of 51 slices shown]
[im 13/51  bone]
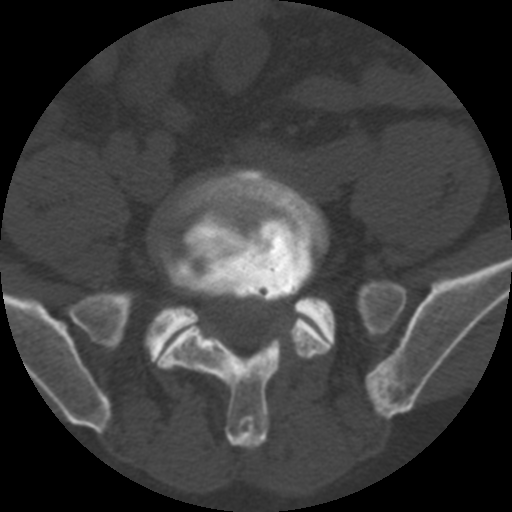
[im 26/51  bone]
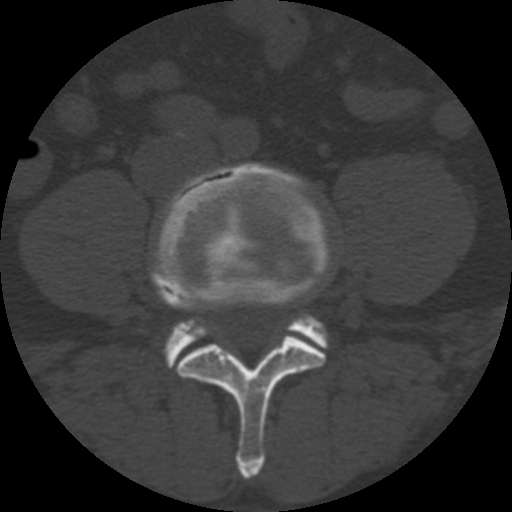
[im 38/51  bone]
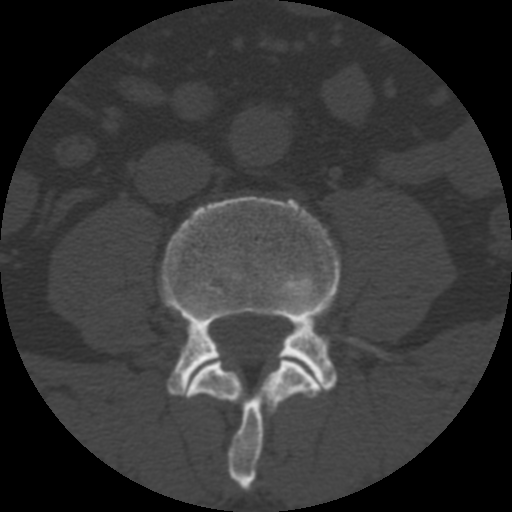

[Series 200: cor l spine · coronal · 0.32mm/px · 3 of 67 slices shown]
[im 14/67  bone]
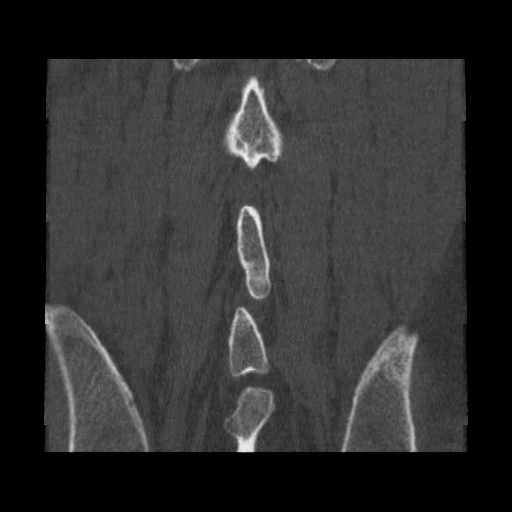
[im 27/67  bone]
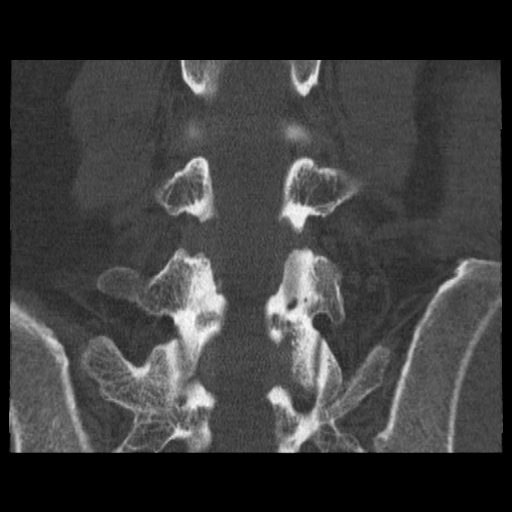
[im 40/67  bone]
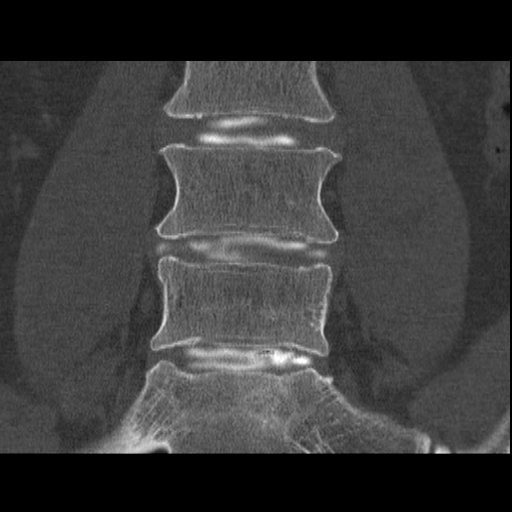

[Series 201: sag l spine · sagittal · 0.32mm/px · 5 of 67 slices shown, 6 images]
[im 23/67  bone]
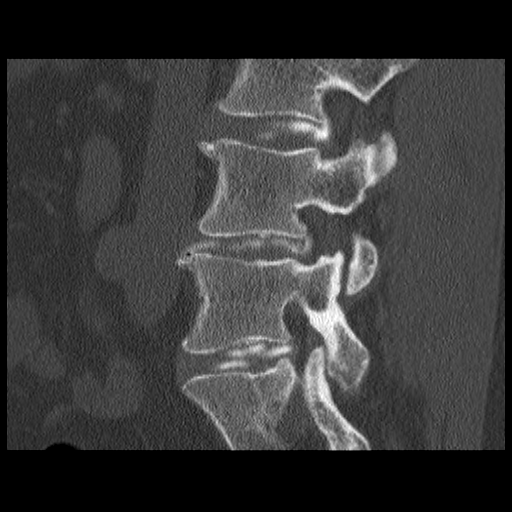
[im 28/67  bone]
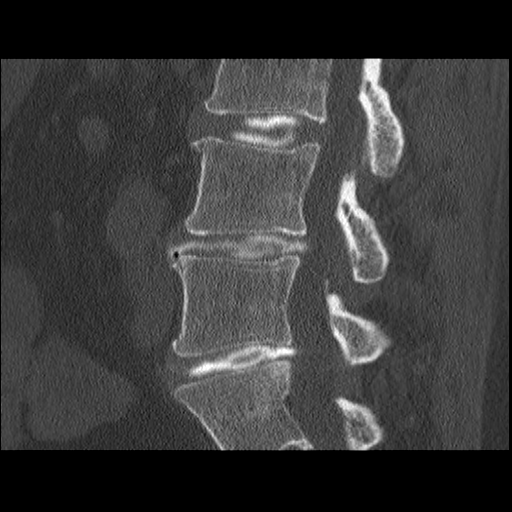
[im 34/67  soft-tissue]
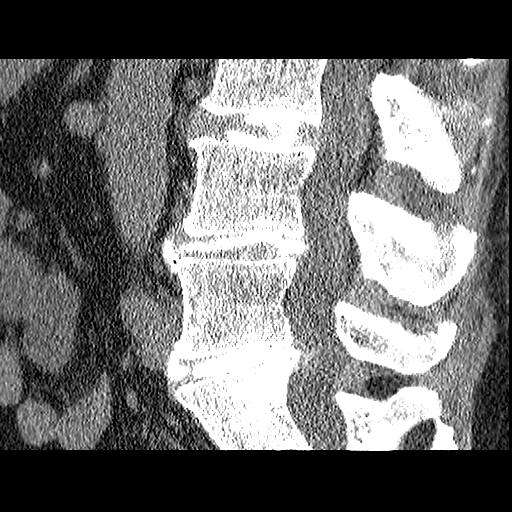
[im 34/67  bone]
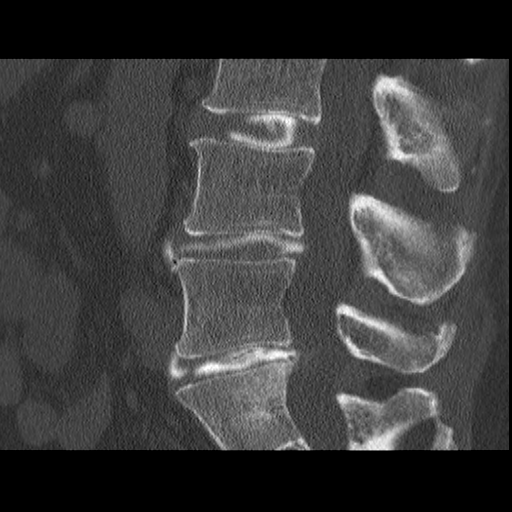
[im 39/67  bone]
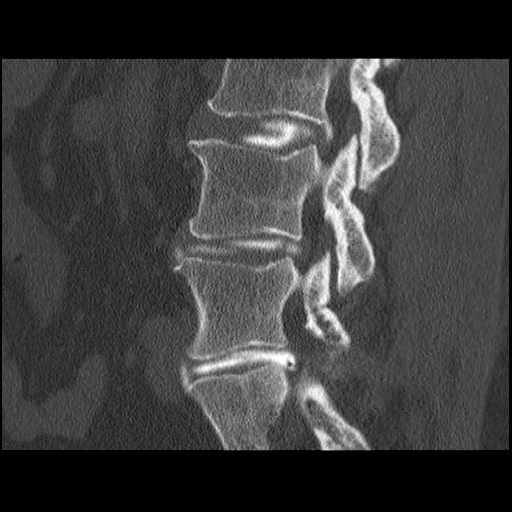
[im 45/67  bone]
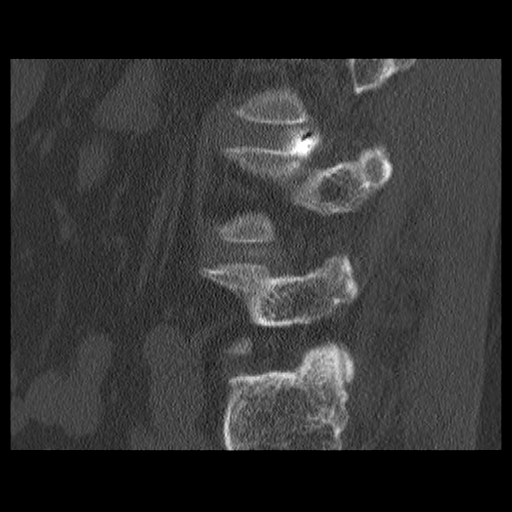

[15 of 33 positions shown; findings below may reference images not displayed]

FINDINGS: The lumbar spine is imaged L3-4, L4-5, and L5-S1 following
discography at these levels.

Limited imaging of the abdomen is unremarkable.

Vertebral body heights and alignment are maintained.

L3-4: Contrast is mostly contained within the central nucleus of the
disc. There is a posterior lateral tract of contrast bilaterally.
The right-sided tract likely represents the needle tract. The other
is concerning for an annular tear. There is an annular tear on the
left. The disc protrudes into the left neural foramen without
significant stenosis.

L4-5: Contrast is evident diffusely throughout the annular portion
of the disc. A broad-based disc protrusion is present. Mild central
and bilateral foraminal narrowing is again seen.

L5-S1: Contrast press diffusely throughout the annular portions of
the disc. A mild broad-based disc protrusion is present. A left
laminectomy is noted. There is no focal stenosis.
IMPRESSION: 1. Left posterolateral annular tear at L3-4. The disc protrudes into
the left neural foramen without significant stenosis.
2. Diffuse disc degeneration at L4-5 and L5-S1 with contrast
bridging throughout the annular portions of the disc.
3. Broad-based disc protrusion at L4-5 with mild subarticular and
foraminal narrowing bilaterally.

## 2024-03-16 ENCOUNTER — Telehealth: Payer: Self-pay | Admitting: Neurology

## 2024-03-16 NOTE — Telephone Encounter (Signed)
 Pt called stating that a referral was sent over . Informed pt we have not receive  a referral at this time. Pt will call PCP.

## 2024-03-21 NOTE — Progress Notes (Signed)
 [facility]  PATIENT: Austin Bolton DOB: [birth year]  REFERRING DOCTOR OR PCP: Majorie Scrape, PA-C SOURCE: Patient, notes from [facility] neurology, imaging and lab reports, imaging studies personally reviewed  _________________________________   HISTORICAL  CHIEF COMPLAINT:  Chief Complaint  Patient presents with   New Patient (Initial Visit)    Pt in room 11.wife in room. Referral from [facility] for difficulty walking. Pt had open heart surgery 3 years ago and since then walking has been difficult. Pt walks with cane, right leg is more weak. Pt saw a neurologist in 2023 has paper work. No falls.     HISTORY OF PRESENT ILLNESS:  I had the pleasure of seeing your patient, Austin Bolton, at [facility] for neurologic consultation regarding his gait disturbance and other symptoms since open heart surgery in 2022.  He is a 63 year old man with reduced gait since 2022.   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  Her surgical recovery in the hospital was fairly unremarkable.  However, his wife noted gait and balance were reduced afterwards.  His knees often give out as he walks, right greater than left.  He uses a cane due to poor balance.   He notes balance is worse on uneven surfaces.    He also has had some cognitive changes since the surgery.  He notes more trouble with word finding and his wife notes some reduced memory and focus.     He is always tired.   He does not snore.  He has had episodes of vertigo.  When he woke up from a nap a couple weeks ago, he felt eyes were jerking and crossed and he had rotational vertigo.   This got better after 15 minutes.  He has had a few periods of a couple weeks at a time with similar vertigo that gradually improves.       He did PT without benefit.   In September 2023 he saw an orthopedist who felt his issues were neurologic.  He ws referred to Dr. Tilda Fogo.    She felt  that issues were related to chronic micro-hemorrhages.  He has urinary frequency.  He noted some issues with this before the surgery but feels it has worsened over the last couple years..  He has a weak stream  He has 4 times nocturia  He has a h/o lumbar surgery x 2.   He is otherwise healthy.   Imaging/Data: MRI of the brain 10/04/2022 showed normal brain volume.  There were about 8 chronic microhemorrhages, one in the cerebellum and vascular hemispheres, some of the gray-white junction.    There are also a small number of T2/FLAIR hyperintense foci consistent with mild chronic microvascular change.  Consider cerebral amyloid angiopathy or sequela of cardiac surgery as the etiology of the chronic microhemorrhages  Labs:   B12 was mildly reduced at 163 (greater than 180 normal)  EEG 10/26/2022 was normal  Carotid U/S showed mild to moderate right carotid stenosis.  ABI was normal   REVIEW OF SYSTEMS: Constitutional: No fevers, chills, sweats, or change in appetite Eyes: No visual changes, double vision, eye pain Ear, nose and throat: No hearing loss, ear pain, nasal congestion, sore throat Cardiovascular: No chest pain, palpitations Respiratory:  No shortness of breath at rest or with exertion.   No wheezes GastrointestinaI: No nausea, vomiting, diarrhea, abdominal pain, fecal incontinence Genitourinary:  No dysuria, urinary retention or frequency.  No nocturia.  Musculoskeletal:  No neck pain, back pain Integumentary: No rash, pruritus, skin lesions Neurological: as above Psychiatric: No depression at this time.  No anxiety Endocrine: No palpitations, diaphoresis, change in appetite, change in weigh or increased thirst Hematologic/Lymphatic:  No anemia, purpura, petechiae. Allergic/Immunologic: No itchy/runny eyes, nasal congestion, recent allergic reactions, rashes  ALLERGIES: No Known Allergies  HOME MEDICATIONS:  Current Outpatient Medications:    Aspirin 81 MG CAPS, Take  81 mg by mouth daily., Disp: , Rfl:    ibuprofen  (ADVIL ,MOTRIN ) 200 MG tablet, Take 4 tablets (800 mg total) by mouth every 6 (six) hours as needed for headache or moderate pain. May resume 4 days post-op (Patient taking differently: Take 800 mg by mouth every 6 (six) hours as needed for headache or moderate pain (pain score 4-6).), Disp: 30 tablet, Rfl: 0   latanoprost (XALATAN) 0.005 % ophthalmic solution, Place 1 drop into both eyes at bedtime., Disp: , Rfl:    methocarbamol  (ROBAXIN ) 500 MG tablet, Take 1 tablet (500 mg total) by mouth every 6 (six) hours as needed for muscle spasms. (Patient not taking: Reported on 03/24/2024), Disp: 60 tablet, Rfl: 1   mupirocin ointment (BACTROBAN) 2 %, 2 (two) times daily. Nose (Patient not taking: Reported on 03/24/2024), Disp: , Rfl:    oxyCODONE -acetaminophen  (PERCOCET/ROXICET) 5-325 MG tablet, Take 1-2 tablets by mouth every 4 (four) hours as needed for moderate pain. (Patient not taking: Reported on 03/24/2024), Disp: 90 tablet, Rfl: 0  PAST MEDICAL HISTORY: Past Medical History:  Diagnosis Date   Anemia    hx of   Bicuspid aortic valve    "no problems"   Blood dyscrasia    DDD (degenerative disc disease), lumbar    Depression    no medication-years ago attempted suicide x1   Elliptocytosis (HCC)    Enlarged aorta (HCC) ASYMPTOMATIC--  NO MEDS   Headache    hx of migraines, none recent   History of concussion CHILD--  NO RESIDUAL   History of kidney stones    HNP (herniated nucleus pulposus)    "at least one"   Nocturia    PONV (postoperative nausea and vomiting)    Urgency of urination     PAST SURGICAL HISTORY: Past Surgical History:  Procedure Laterality Date   ABDOMINAL EXPOSURE N/A 11/17/2015   Procedure: ABDOMINAL EXPOSURE;  Surgeon: Dannis Dy, MD;  Location: [facility];  Service: Vascular;  Laterality: N/A;   ANTERIOR LUMBAR FUSION N/A 11/17/2015   Procedure: LUMBAR FIVE SACRAL ONE ANTERIOR LUMBAR INTERBODY FUSIONL;   Surgeon: Isadora Mar, MD;  Location: [facility];  Service: Neurosurgery;  Laterality: N/A;   CHOLECYSTECTOMY  ~1992   CYSTOSCOPY W/ URETERAL STENT REMOVAL  05/13/2012   Procedure: CYSTOSCOPY WITH STENT REMOVAL;  Surgeon: Willye Harvey, MD;  Location: [facility];  Service: Urology;  Laterality: Right;  flexible cystoscope   KNEE SURGERY  1978   RIGHT   LUMBAR LAMINECTOMY/DECOMPRESSION MICRODISCECTOMY Left 04/06/2015   Procedure: MICRO LUMBAR DECOMPRESSION  L5-S1 ON LEFT;  Surgeon: Orvan Blanch, MD;  Location: [facility];  Service: Orthopedics;  Laterality: Left;   RIGHT URETEROSCOPIC STONE EXTRACTION / STENT PLACEMENT  05-08-2012   DR WRENN   URETEROLITHOTOMY  2001    FAMILY HISTORY: Family History  Problem Relation Age of Onset   Colon cancer Mother     SOCIAL HISTORY: Social History   Socioeconomic History   Marital status: Married    Spouse name: Not on file  Number of children: Not on file   Years of education: Not on file   Highest education level: Not on file  Occupational History   Not on file  Tobacco Use   Smoking status: Never   Smokeless tobacco: Never  Vaping Use   Vaping status: Never Used  Substance and Sexual Activity   Alcohol use: No   Drug use: Yes    Types: Marijuana    Comment: OCCASIONAL USE OF MARIJUANA--   LAST USED FEW WEEKS AGO    Sexual activity: Not on file  Other Topics Concern   Not on file  Social History Narrative   Not on file   Social Drivers of Health   Financial Resource Strain: Not on file  Food Insecurity: Low Risk  (02/05/2024)   Received from [facility]   Hunger Vital Sign    Worried About Running Out of Food in the Last Year: Never true    Ran Out of Food in the Last Year: Never true  Transportation Needs: No Transportation Needs (02/05/2024)   Received from [employer]    In the past 12 months, has lack of reliable transportation kept you from medical appointments, meetings, work or from  getting things needed for daily living? : No  Physical Activity: Not on file  Stress: Not on file  Social Connections: Not on file  Intimate Partner Violence: Not on file       PHYSICAL EXAM  Vitals:   03/24/24 1325  BP: (!) 140/75  Pulse: 73  SpO2: 98%  Weight: 154 lb (69.9 kg)  Height: 6\' 2"  (1.88 m)    Body mass index is 19.77 kg/m.   General: The patient is well-developed and well-nourished and in no acute distress  HEENT:  Head is [place]/AT.  Sclera are anicteric.     Neck: No carotid bruits are noted.  The neck is nontender.  Cardiovascular: The heart has a regular rate and rhythm with a normal S1 and S2. There were no murmurs, gallops or rubs.    Skin: Extremities are without rash or  edema.  Musculoskeletal:  Back is nontender  Neurologic Exam  Mental status: The patient is alert and oriented x 3 at the time of the examination. The patient has apparent normal recent and remote memory, with an apparently normal attention span and concentration ability.   Speech is normal.  Cranial nerves: Extraocular movements are full. Pupils are equal, round, and reactive to light and accomodation.  Visual fields are full.  Facial symmetry is present. There is good facial sensation to soft touch bilaterally.Facial strength is normal.  Trapezius and sternocleidomastoid strength is normal. No dysarthria is noted.  The tongue is midline, and the patient has symmetric elevation of the soft palate. No obvious hearing deficits are noted.  Motor:  Muscle bulk is normal.   Tone is normal. Strength is  5 / 5 in the arms and left leg.  Strength is 4+/5 in the right iliopsoas and ankle and toe extensors  Sensory: Sensory testing is intact to pinprick, soft touch and vibration sensation in arms.  He has vibration sensation at the right ankle (40%) and right toes (20%).  Only minimal reduced ankle and toe vibration sensation on the left.  Touch sensation was more symmetric.  Coordination:  Cerebellar testing reveals good finger-nose-finger and heel-to-shin bilaterally.  Gait and station: Station is normal.   Gait is wide with mild right leg wekness.  He cannot safely tandem. Romberg  is borderline.   Reflexes: Deep tendon reflexes are symmetric and normal bilaterally.   Plantar responses are flexor.    DIAGNOSTIC DATA (LABS, IMAGING, TESTING) - I reviewed patient records, labs, notes, testing and imaging myself where available.  Lab Results  Component Value Date   WBC 4.0 11/08/2015   HGB 14.1 11/08/2015   HCT 40.6 11/08/2015   MCV 88.5 11/08/2015   PLT 180 11/08/2015      Component Value Date/Time   NA 140 11/08/2015 1106   K 4.8 11/08/2015 1106   CL 106 11/08/2015 1106   CO2 28 11/08/2015 1106   GLUCOSE 109 (H) 11/08/2015 1106   BUN 8 11/08/2015 1106   CREATININE 1.05 11/08/2015 1106   CALCIUM 9.5 11/08/2015 1106   PROT 6.6 11/08/2015 1106   ALBUMIN 4.2 11/08/2015 1106   AST 16 11/08/2015 1106   ALT 14 (L) 11/08/2015 1106   ALKPHOS 45 11/08/2015 1106   BILITOT 1.3 (H) 11/08/2015 1106   GFRNONAA >60 11/08/2015 1106   GFRAA >60 11/08/2015 1106       ASSESSMENT AND PLAN  Gait disturbance - Plan: MR CERVICAL SPINE W WO CONTRAST, MR Lumbar Spine W Wo Contrast  S/P lumbar spinal fusion - Plan: MR Lumbar Spine W Wo Contrast  Abnormal brain MRI  Numbness - Plan: Multiple Myeloma Panel (SPEP&IFE w/QIG), Copper, serum  Neuropathy - Plan: Multiple Myeloma Panel (SPEP&IFE w/QIG), Copper, serum   In summary, Mr. Kosman is a 63 year old man who has had difficulties with gait over the last few years .  Additionally he has had reduced verbal fluency and focus/memory.  Symptoms appear to have started after valve replacement surgery.  Additionally he has about 8 chronic microhemorrhages on the brain MRI done recently.  The pattern could be seen with changes from heart bypass or possibly very early cerebral amyloid angiopathy.  Laboratory test had shown low  B12.  To further evaluate his gait disturbance with mild right leg weakness and numbness, we will check an MRI of the cervical spine and lumbar spine.  Based on these results he may need referral to surgery or other intervention.  He would prefer to take B12 pills rather than shots.  He will take 1000 mcg daily and we will recheck the B12 in about 8 weeks.  If well into the normal range he could continue taking oral supplements.  Otherwise, I would strongly recommend that he try the shots.  Because of the numbness, we will also check copper and SPEP/IEF.  He will return to see me in 6 months for regular visit but sooner based on the results of the studies or if he has significant new or worsening neurologic symptoms.  This visit is part of a comprehensive longitudinal care medical relationship regarding the patients primary diagnosis of gait disturbance and related concerns.   Josalyn Dettmann A. Godwin Lat, MD, Laurita Porta 03/24/2024, 8:03 PM Certified in Neurology, Clinical Neurophysiology, Sleep Medicine and Neuroimaging  [facility] [address], [address] [place], [place] [address] [phone]

## 2024-03-24 ENCOUNTER — Ambulatory Visit (INDEPENDENT_AMBULATORY_CARE_PROVIDER_SITE_OTHER): Payer: Self-pay | Admitting: Neurology

## 2024-03-24 ENCOUNTER — Encounter: Payer: Self-pay | Admitting: Neurology

## 2024-03-24 VITALS — BP 140/75 | HR 73 | Ht 74.0 in | Wt 154.0 lb

## 2024-03-24 DIAGNOSIS — R9089 Other abnormal findings on diagnostic imaging of central nervous system: Secondary | ICD-10-CM | POA: Diagnosis not present

## 2024-03-24 DIAGNOSIS — R269 Unspecified abnormalities of gait and mobility: Secondary | ICD-10-CM | POA: Diagnosis not present

## 2024-03-24 DIAGNOSIS — Z981 Arthrodesis status: Secondary | ICD-10-CM

## 2024-03-24 DIAGNOSIS — E538 Deficiency of other specified B group vitamins: Secondary | ICD-10-CM

## 2024-03-24 DIAGNOSIS — G629 Polyneuropathy, unspecified: Secondary | ICD-10-CM | POA: Diagnosis not present

## 2024-03-24 DIAGNOSIS — R200 Anesthesia of skin: Secondary | ICD-10-CM

## 2024-03-27 LAB — MULTIPLE MYELOMA PANEL, SERUM
Albumin SerPl Elph-Mcnc: 3.9 g/dL (ref 2.9–4.4)
Albumin/Glob SerPl: 1.5 (ref 0.7–1.7)
Alpha 1: 0.2 g/dL (ref 0.0–0.4)
Alpha2 Glob SerPl Elph-Mcnc: 0.4 g/dL (ref 0.4–1.0)
B-Globulin SerPl Elph-Mcnc: 1.1 g/dL (ref 0.7–1.3)
Gamma Glob SerPl Elph-Mcnc: 1 g/dL (ref 0.4–1.8)
Globulin, Total: 2.7 g/dL (ref 2.2–3.9)
IgA/Immunoglobulin A, Serum: 397 mg/dL (ref 61–437)
IgG (Immunoglobin G), Serum: 1001 mg/dL (ref 603–1613)
IgM (Immunoglobulin M), Srm: 19 mg/dL — ABNORMAL LOW (ref 20–172)
Total Protein: 6.6 g/dL (ref 6.0–8.5)

## 2024-03-27 LAB — COPPER, SERUM: Copper: 65 ug/dL — ABNORMAL LOW (ref 69–132)

## 2024-04-01 ENCOUNTER — Ambulatory Visit: Payer: Self-pay | Admitting: Neurology

## 2024-04-01 NOTE — Telephone Encounter (Signed)
 Called and spoke with pt. Relayed results per Dr. Thom Fleeting note. Pt verbalized understanding.

## 2024-04-01 NOTE — Telephone Encounter (Signed)
-----   Message from Austin Bolton sent at 04/01/2024 11:53 AM EDT ----- Please let him know that the copper was mildly low.  He can take an over-the-counter supplement (2 mg)

## 2024-04-03 ENCOUNTER — Telehealth: Payer: Self-pay | Admitting: Neurology

## 2024-04-03 NOTE — Telephone Encounter (Signed)
 [facility] NPR Case Number: [id number] sent to GI [phone]

## 2024-05-13 ENCOUNTER — Ambulatory Visit
Admission: RE | Admit: 2024-05-13 | Discharge: 2024-05-13 | Disposition: A | Discharge status: Home / Self Care | Payer: Self-pay | Source: Ambulatory Visit | Attending: Neurology | Admitting: Neurology

## 2024-05-13 DIAGNOSIS — R269 Unspecified abnormalities of gait and mobility: Secondary | ICD-10-CM | POA: Diagnosis not present

## 2024-05-13 DIAGNOSIS — Z981 Arthrodesis status: Secondary | ICD-10-CM

## 2024-05-13 MED ORDER — GADOPICLENOL 0.5 MMOL/ML IV SOLN
7.0000 mL | Freq: Once | INTRAVENOUS | Status: AC | PRN
  Administered 2024-05-13: 7 mL via INTRAVENOUS

## 2024-10-22 ENCOUNTER — Encounter: Payer: Self-pay | Admitting: Neurology

## 2024-10-22 ENCOUNTER — Ambulatory Visit: Admitting: Neurology

## 2024-10-22 ENCOUNTER — Telehealth: Payer: Self-pay | Admitting: Neurology

## 2024-10-22 VITALS — BP 116/76 | HR 89 | Temp 98.2°F | Ht 73.5 in | Wt 169.0 lb

## 2024-10-22 DIAGNOSIS — E538 Deficiency of other specified B group vitamins: Secondary | ICD-10-CM

## 2024-10-22 DIAGNOSIS — R200 Anesthesia of skin: Secondary | ICD-10-CM

## 2024-10-22 DIAGNOSIS — Z981 Arthrodesis status: Secondary | ICD-10-CM | POA: Diagnosis not present

## 2024-10-22 DIAGNOSIS — G629 Polyneuropathy, unspecified: Secondary | ICD-10-CM | POA: Diagnosis not present

## 2024-10-22 DIAGNOSIS — H8110 Benign paroxysmal vertigo, unspecified ear: Secondary | ICD-10-CM

## 2024-10-22 DIAGNOSIS — R269 Unspecified abnormalities of gait and mobility: Secondary | ICD-10-CM | POA: Diagnosis not present

## 2024-10-22 DIAGNOSIS — R9089 Other abnormal findings on diagnostic imaging of central nervous system: Secondary | ICD-10-CM | POA: Diagnosis not present

## 2024-10-22 NOTE — Progress Notes (Signed)
 [facility]  PATIENT: Austin Bolton DOB: 26-Apr-1961  REFERRING DOCTOR OR PCP: Elvie Mody, PA-C SOURCE: Patient, notes from [facility], imaging and lab reports, imaging studies personally reviewed  _________________________________   HISTORICAL  CHIEF COMPLAINT:  Chief Complaint  Patient presents with   Gait Problem    HISTORY OF PRESENT ILLNESS:   Austin Bolton is a 63 y.o. man with gait disturbance and other symptoms since open heart surgery in 2022.  UPDATE 10/22/2024  SInce the last visit he had MRI of the cervical and lumbar    These show DJD but no explanation for gait issues and leg numbness  He had low B12 and took a month of pills but has since stopped.   Copper  was a little low at 65 (69-130).   SPEP was ok with no paraprotein  He had the onset of vertigo that improved over 2-3 weeks.  This was positional wit hepisodes lasting 30-90 seconds with some head movements.   He did the Epley daily and slowly improved.   He is fine now  He did PT without benefit.   In September 2023 he saw an orthopedist who felt his issues were neurologic.  He ws referred to Dr. Jenel.    She felt that issues were related to chronic micro-hemorrhages.  He has urinary frequency and urgency.  He noted some issues with this before the surgery but feels it has worsened over the last couple years..  He has a weak stream  He has 4 times nocturia  He has a h/o lumbar surgery x 2.   He is otherwise healthy.   H/O gait /numbness issues He has had reduced gait since 2022.   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  Her surgical recovery in the hospital was fairly unremarkable.  However, his wife noted gait and balance were reduced afterwards.  His knees often give out as he walks, right greater than left.  He uses a cane due to poor balance.   He notes balance is worse on uneven surfaces.    He also has had some cognitive  changes since the surgery.  He notes more trouble with word finding and his wife notes some reduced memory and focus.     He is always tired.   He does not snore.  He has had episodes of vertigo.  When he woke up from a nap a couple weeks ago, he felt eyes were jerking and crossed and he had rotational vertigo.   This got better after 15 minutes.  He has had a few periods of a couple weeks at a time with similar vertigo that gradually improves.        Imaging/Data: MRI of the brain 10/04/2022 showed normal brain volume.  There were about 8 chronic microhemorrhages, one in the cerebellum and vascular hemispheres, some of the gray-white junction.    There are also a small number of T2/FLAIR hyperintense foci consistent with mild chronic microvascular change.  Consider cerebral amyloid angiopathy or sequela of cardiac surgery as the etiology of the chronic microhemorrhages  Labs:   B12 was mildly reduced at 163 (greater than 180 normal)  EEG 10/26/2022 was normal  Carotid U/S showed mild to moderate right carotid stenosis.  ABI was normal   REVIEW OF SYSTEMS: Constitutional: No fevers, chills, sweats, or change in appetite Eyes: No visual changes, double vision, eye pain Ear, nose and throat: No hearing loss, ear pain,  nasal congestion, sore throat Cardiovascular: No chest pain, palpitations Respiratory:  No shortness of breath at rest or with exertion.   No wheezes GastrointestinaI: No nausea, vomiting, diarrhea, abdominal pain, fecal incontinence Genitourinary:  No dysuria, urinary retention or frequency.  No nocturia. Musculoskeletal:  No neck pain, back pain Integumentary: No rash, pruritus, skin lesions Neurological: as above Psychiatric: No depression at this time.  No anxiety Endocrine: No palpitations, diaphoresis, change in appetite, change in weigh or increased thirst Hematologic/Lymphatic:  No anemia, purpura, petechiae. Allergic/Immunologic: No itchy/runny eyes, nasal  congestion, recent allergic reactions, rashes  ALLERGIES: No Known Allergies  HOME MEDICATIONS:  Current Outpatient Medications:    Aspirin 81 MG CAPS, Take 81 mg by mouth daily., Disp: , Rfl:    ibuprofen  (ADVIL ,MOTRIN ) 200 MG tablet, Take 4 tablets (800 mg total) by mouth every 6 (six) hours as needed for headache or moderate pain. May resume 4 days post-op, Disp: 30 tablet, Rfl: 0   latanoprost (XALATAN) 0.005 % ophthalmic solution, Place 1 drop into both eyes at bedtime., Disp: , Rfl:    copper  tablet, Take 2 mg by mouth daily., Disp: , Rfl:    methocarbamol  (ROBAXIN ) 500 MG tablet, Take 1 tablet (500 mg total) by mouth every 6 (six) hours as needed for muscle spasms. (Patient not taking: Reported on 03/24/2024), Disp: 60 tablet, Rfl: 1   mupirocin ointment (BACTROBAN) 2 %, 2 (two) times daily. Nose (Patient not taking: Reported on 03/24/2024), Disp: , Rfl:    oxyCODONE -acetaminophen  (PERCOCET/ROXICET) 5-325 MG tablet, Take 1-2 tablets by mouth every 4 (four) hours as needed for moderate pain. (Patient not taking: Reported on 03/24/2024), Disp: 90 tablet, Rfl: 0  PAST MEDICAL HISTORY: Past Medical History:  Diagnosis Date   Anemia    hx of   Bicuspid aortic valve    no problems   Blood dyscrasia    DDD (degenerative disc disease), lumbar    Depression    no medication-years ago attempted suicide x1   Elliptocytosis    Enlarged aorta ASYMPTOMATIC--  NO MEDS   Headache    hx of migraines, none recent   History of concussion CHILD--  NO RESIDUAL   History of kidney stones    HNP (herniated nucleus pulposus)    at least one   Nocturia    PONV (postoperative nausea and vomiting)    Urgency of urination     PAST SURGICAL HISTORY: Past Surgical History:  Procedure Laterality Date   ABDOMINAL EXPOSURE N/A 11/17/2015   Procedure: ABDOMINAL EXPOSURE;  Surgeon: Lonni GORMAN Blade, MD;  Location: [facility];  Service: Vascular;  Laterality: N/A;   ANTERIOR LUMBAR FUSION N/A  11/17/2015   Procedure: LUMBAR FIVE SACRAL ONE ANTERIOR LUMBAR INTERBODY FUSIONL;  Surgeon: Alm GORMAN Molt, MD;  Location: [facility];  Service: Neurosurgery;  Laterality: N/A;   CHOLECYSTECTOMY  ~1992   CYSTOSCOPY W/ URETERAL STENT REMOVAL  05/13/2012   Procedure: CYSTOSCOPY WITH STENT REMOVAL;  Surgeon: Norleen JINNY Seltzer, MD;  Location: [facility];  Service: Urology;  Laterality: Right;  flexible cystoscope   KNEE SURGERY  1978   RIGHT   LUMBAR LAMINECTOMY/DECOMPRESSION MICRODISCECTOMY Left 04/06/2015   Procedure: MICRO LUMBAR DECOMPRESSION  L5-S1 ON LEFT;  Surgeon: Reyes Billing, MD;  Location: [facility];  Service: Orthopedics;  Laterality: Left;   RIGHT URETEROSCOPIC STONE EXTRACTION / STENT PLACEMENT  05-08-2012   DR WRENN   URETEROLITHOTOMY  2001    FAMILY HISTORY: Family History  Problem Relation Age  of Onset   Colon cancer Mother     SOCIAL HISTORY: Social History   Socioeconomic History   Marital status: Married    Spouse name: Not on file   Number of children: Not on file   Years of education: Not on file   Highest education level: Not on file  Occupational History   Not on file  Tobacco Use   Smoking status: Never   Smokeless tobacco: Never  Vaping Use   Vaping status: Never Used  Substance and Sexual Activity   Alcohol use: No   Drug use: Yes    Types: Marijuana    Comment: OCCASIONAL USE OF MARIJUANA--   LAST USED FEW WEEKS AGO    Sexual activity: Not on file  Other Topics Concern   Not on file  Social History Narrative   Not on file   Social Drivers of Health   Tobacco Use: Low Risk (09/16/2024)   Received from [facility]   Patient History    Smoking Tobacco Use: Never    Smokeless Tobacco Use: Never    Passive Exposure: Never  Financial Resource Strain: Not on file  Food Insecurity: Low Risk (02/05/2024)   Received from [facility]   Epic    Within the past 12 months, you worried that your food would run out before you got money to buy  more: Never true    Within the past 12 months, the food you bought just didn't last and you didn't have money to get more. : Never true  Transportation Needs: No Transportation Needs (02/05/2024)   Received from [facility]    In the past 12 months, has lack of reliable transportation kept you from medical appointments, meetings, work or from getting things needed for daily living? : No  Physical Activity: Not on file  Stress: Not on file  Social Connections: Not on file  Intimate Partner Violence: Not on file  Depression (EYV7-0): Not on file  Alcohol Screen: Not on file  Housing: Low Risk (02/05/2024)   Received from [facility]   Epic    What is your living situation today?: I have a steady place to live    Think about the place you live. Do you have problems with any of the following? Choose all that apply:: None/None on this list  Utilities: Low Risk (02/05/2024)   Received from [facility]   Utilities    In the past 12 months has the electric, gas, oil, or water  company threatened to shut off services in your home? : No  Health Literacy: Not on file       PHYSICAL EXAM  Vitals:   10/22/24 0825  BP: 116/76  Pulse: 89  Temp: 98.2 F (36.8 C)  TempSrc: Oral  SpO2: 98%  Weight: 169 lb (76.7 kg)  Height: 6' 1.5 (1.867 m)    Body mass index is 21.99 kg/m.   General: The patient is well-developed and well-nourished and in no acute distress  HEENT:  Head is [place]/AT.  Sclera are anicteric.     Neck: No carotid bruits are noted.  The neck is nontender.  Cardiovascular: The heart has a regular rate and rhythm with a normal S1 and S2. There were no murmurs, gallops or rubs.    Skin: Extremities are without rash or  edema.  Musculoskeletal:  Back is nontender  Neurologic Exam  Mental status: The patient is alert and oriented x 3 at the time of the examination.  The patient has apparent normal recent and remote memory, with an apparently normal  attention span and concentration ability.   Speech is normal.  Cranial nerves: Extraocular movements are full. Pupils are equal, round, and reactive to light and accomodation.  Visual fields are full.  Facial symmetry is present. There is good facial sensation to soft touch bilaterally.Facial strength is normal.  Trapezius and sternocleidomastoid strength is normal. No dysarthria is noted.  The tongue is midline, and the patient has symmetric elevation of the soft palate. No obvious hearing deficits are noted.  Motor:  Muscle bulk is normal.   Tone is normal. Strength is  5 / 5 in the arms and left leg.  Strength is 4+/5 in the right iliopsoas and ankle and toe extensors  Sensory: Sensory testing is intact to pinprick, soft touch and vibration sensation in arms.  He has vibration sensation at the right ankle (40%) and right toes (20%).  Only minimal reduced ankle and toe vibration sensation on the left.  Touch sensation was more symmetric.  Coordination: Cerebellar testing reveals good finger-nose-finger and heel-to-shin bilaterally.  Gait and station: Station is normal.   Gait is wide with mild right leg wekness.  He cannot safely tandem. Romberg is borderline.   Reflexes: Deep tendon reflexes are symmetric and normal bilaterally.   Plantar responses are flexor.    DIAGNOSTIC DATA (LABS, IMAGING, TESTING) - I reviewed patient records, labs, notes, testing and imaging myself where available.  Lab Results  Component Value Date   WBC 4.0 11/08/2015   HGB 14.1 11/08/2015   HCT 40.6 11/08/2015   MCV 88.5 11/08/2015   PLT 180 11/08/2015      Component Value Date/Time   NA 140 11/08/2015 1106   K 4.8 11/08/2015 1106   CL 106 11/08/2015 1106   CO2 28 11/08/2015 1106   GLUCOSE 109 (H) 11/08/2015 1106   BUN 8 11/08/2015 1106   CREATININE 1.05 11/08/2015 1106   CALCIUM 9.5 11/08/2015 1106   PROT 6.6 03/24/2024 1443   ALBUMIN 4.2 11/08/2015 1106   AST 16 11/08/2015 1106   ALT 14 (L)  11/08/2015 1106   ALKPHOS 45 11/08/2015 1106   BILITOT 1.3 (H) 11/08/2015 1106   GFRNONAA >60 11/08/2015 1106   GFRAA >60 11/08/2015 1106       ASSESSMENT AND PLAN  Gait disturbance - Plan: Vitamin B12, MR THORACIC SPINE WO CONTRAST  Neuropathy - Plan: Vitamin B12  B12 deficiency - Plan: Vitamin B12  S/P lumbar spinal fusion  Numbness - Plan: MR THORACIC SPINE WO CONTRAST  Abnormal brain MRI   Recheck B12.  If normal, continue OTC pills, however if lower than earlier in year consider shots.    Copper  2 mg daily po OTC MRI thoracic spine -  if normal, consider NCV/EMG to determine if polyneuropathy exists and if  demyelinating or axonal If vertigo recurs, will do Epley and call us  or PCP if not better.  RTC 7-8 months   call sooner for new or worsening neurologic issues.    This visit is part of a comprehensive longitudinal care medical relationship regarding the patients primary diagnosis of gait disturbance and related concerns.   Wrenly Lauritsen A. Vear, MD, [facility] 10/22/2024, 9:12 AM Certified in Neurology, Clinical Neurophysiology, Sleep Medicine and Neuroimaging  [facility] [address] [place], [place] [address] [phone]

## 2024-10-22 NOTE — Telephone Encounter (Signed)
 no auth required sent to [facility] [phone]

## 2024-10-23 ENCOUNTER — Ambulatory Visit: Payer: Self-pay | Admitting: Neurology

## 2024-10-23 LAB — VITAMIN B12: Vitamin B-12: 371 pg/mL (ref 232–1245)

## 2024-11-11 ENCOUNTER — Encounter: Payer: Self-pay | Admitting: Neurology

## 2024-11-15 ENCOUNTER — Ambulatory Visit
Admission: RE | Admit: 2024-11-15 | Discharge: 2024-11-15 | Disposition: A | Source: Ambulatory Visit | Attending: Neurology | Admitting: Neurology

## 2024-11-15 DIAGNOSIS — R269 Unspecified abnormalities of gait and mobility: Secondary | ICD-10-CM

## 2024-11-15 DIAGNOSIS — R200 Anesthesia of skin: Secondary | ICD-10-CM
